# Patient Record
Sex: Female | Born: 1960 | Race: Black or African American | Hispanic: No | Marital: Single | Smoking: Light tobacco smoker
Health system: Southern US, Community
[De-identification: ages and names within clinical notes are randomized; demographics above are authoritative.]

## PROBLEM LIST (undated history)

## (undated) DIAGNOSIS — T7840XA Allergy, unspecified, initial encounter: Secondary | ICD-10-CM

## (undated) DIAGNOSIS — E785 Hyperlipidemia, unspecified: Secondary | ICD-10-CM

## (undated) DIAGNOSIS — D649 Anemia, unspecified: Secondary | ICD-10-CM

## (undated) DIAGNOSIS — D329 Benign neoplasm of meninges, unspecified: Secondary | ICD-10-CM

## (undated) DIAGNOSIS — I10 Essential (primary) hypertension: Secondary | ICD-10-CM

## (undated) DIAGNOSIS — H5440 Blindness, one eye, unspecified eye: Secondary | ICD-10-CM

## (undated) HISTORY — DX: Benign neoplasm of meninges, unspecified: D32.9

## (undated) HISTORY — DX: Hyperlipidemia, unspecified: E78.5

## (undated) HISTORY — PX: TUMOR REMOVAL: SHX12

## (undated) HISTORY — DX: Blindness, one eye, unspecified eye: H54.40

## (undated) HISTORY — DX: Essential (primary) hypertension: I10

## (undated) HISTORY — DX: Allergy, unspecified, initial encounter: T78.40XA

## (undated) HISTORY — DX: Anemia, unspecified: D64.9

---

## 2000-06-08 ENCOUNTER — Ambulatory Visit (HOSPITAL_COMMUNITY): Admission: RE | Admit: 2000-06-08 | Discharge: 2000-06-08 | Payer: Self-pay | Admitting: *Deleted

## 2000-08-12 ENCOUNTER — Ambulatory Visit (HOSPITAL_COMMUNITY): Admission: RE | Admit: 2000-08-12 | Discharge: 2000-08-12 | Payer: Self-pay | Admitting: *Deleted

## 2000-10-14 ENCOUNTER — Inpatient Hospital Stay (HOSPITAL_COMMUNITY): Admission: AD | Admit: 2000-10-14 | Discharge: 2000-10-18 | Payer: Self-pay | Admitting: *Deleted

## 2000-10-15 ENCOUNTER — Encounter: Payer: Self-pay | Admitting: Obstetrics & Gynecology

## 2000-10-16 ENCOUNTER — Encounter: Payer: Self-pay | Admitting: Obstetrics & Gynecology

## 2001-06-23 ENCOUNTER — Emergency Department (HOSPITAL_COMMUNITY): Admission: EM | Admit: 2001-06-23 | Discharge: 2001-06-23 | Payer: Self-pay | Admitting: Emergency Medicine

## 2001-10-05 ENCOUNTER — Emergency Department (HOSPITAL_COMMUNITY): Admission: EM | Admit: 2001-10-05 | Discharge: 2001-10-05 | Payer: Self-pay | Admitting: Emergency Medicine

## 2003-12-25 ENCOUNTER — Ambulatory Visit (HOSPITAL_COMMUNITY): Admission: RE | Admit: 2003-12-25 | Discharge: 2003-12-25 | Payer: Self-pay | Admitting: *Deleted

## 2004-12-30 ENCOUNTER — Ambulatory Visit (HOSPITAL_COMMUNITY): Admission: RE | Admit: 2004-12-30 | Discharge: 2004-12-30 | Payer: Self-pay | Admitting: *Deleted

## 2005-07-03 ENCOUNTER — Emergency Department (HOSPITAL_COMMUNITY): Admission: EM | Admit: 2005-07-03 | Discharge: 2005-07-03 | Payer: Self-pay | Admitting: Emergency Medicine

## 2005-12-23 ENCOUNTER — Emergency Department (HOSPITAL_COMMUNITY): Admission: EM | Admit: 2005-12-23 | Discharge: 2005-12-23 | Payer: Self-pay | Admitting: Emergency Medicine

## 2006-01-05 ENCOUNTER — Ambulatory Visit (HOSPITAL_COMMUNITY): Admission: RE | Admit: 2006-01-05 | Discharge: 2006-01-05 | Payer: Self-pay | Admitting: *Deleted

## 2007-01-06 ENCOUNTER — Ambulatory Visit (HOSPITAL_COMMUNITY): Admission: RE | Admit: 2007-01-06 | Discharge: 2007-01-06 | Payer: Self-pay | Admitting: Obstetrics & Gynecology

## 2007-06-30 ENCOUNTER — Emergency Department (HOSPITAL_COMMUNITY): Admission: EM | Admit: 2007-06-30 | Discharge: 2007-06-30 | Payer: Self-pay | Admitting: Emergency Medicine

## 2007-08-11 ENCOUNTER — Emergency Department (HOSPITAL_COMMUNITY): Admission: EM | Admit: 2007-08-11 | Discharge: 2007-08-11 | Payer: Self-pay | Admitting: Emergency Medicine

## 2008-01-10 ENCOUNTER — Ambulatory Visit (HOSPITAL_COMMUNITY): Admission: RE | Admit: 2008-01-10 | Discharge: 2008-01-10 | Payer: Self-pay | Admitting: Obstetrics & Gynecology

## 2008-02-12 ENCOUNTER — Emergency Department (HOSPITAL_COMMUNITY): Admission: EM | Admit: 2008-02-12 | Discharge: 2008-02-12 | Payer: Self-pay | Admitting: Emergency Medicine

## 2008-03-13 ENCOUNTER — Ambulatory Visit: Payer: Self-pay | Admitting: Family Medicine

## 2008-03-13 ENCOUNTER — Encounter (INDEPENDENT_AMBULATORY_CARE_PROVIDER_SITE_OTHER): Payer: Self-pay | Admitting: Nurse Practitioner

## 2008-03-13 LAB — CONVERTED CEMR LAB
ALT: 17 units/L (ref 0–35)
AST: 18 units/L (ref 0–37)
BUN: 14 mg/dL (ref 6–23)
Basophils Absolute: 0 10*3/uL (ref 0.0–0.1)
CO2: 23 meq/L (ref 19–32)
Calcium: 9.8 mg/dL (ref 8.4–10.5)
Chloride: 105 meq/L (ref 96–112)
Eosinophils Relative: 1 % (ref 0–5)
Lymphocytes Relative: 30 % (ref 12–46)
MCHC: 33 g/dL (ref 30.0–36.0)
MCV: 91.5 fL (ref 78.0–100.0)
Monocytes Relative: 4 % (ref 3–12)
Neutro Abs: 4 10*3/uL (ref 1.7–7.7)
Platelets: 396 10*3/uL (ref 150–400)
Potassium: 4.1 meq/L (ref 3.5–5.3)
Sodium: 142 meq/L (ref 135–145)
Total Bilirubin: 1.2 mg/dL (ref 0.3–1.2)
Total Protein: 8.8 g/dL — ABNORMAL HIGH (ref 6.0–8.3)
WBC: 6.2 10*3/uL (ref 4.0–10.5)

## 2008-03-16 ENCOUNTER — Ambulatory Visit: Payer: Self-pay | Admitting: *Deleted

## 2008-06-11 ENCOUNTER — Ambulatory Visit: Payer: Self-pay | Admitting: Internal Medicine

## 2008-11-04 ENCOUNTER — Emergency Department (HOSPITAL_COMMUNITY): Admission: EM | Admit: 2008-11-04 | Discharge: 2008-11-04 | Payer: Self-pay | Admitting: Emergency Medicine

## 2009-12-14 HISTORY — PX: CYST REMOVAL HAND: SHX6279

## 2010-04-22 ENCOUNTER — Emergency Department (HOSPITAL_COMMUNITY): Admission: EM | Admit: 2010-04-22 | Discharge: 2010-04-22 | Payer: Self-pay | Admitting: Emergency Medicine

## 2010-05-14 ENCOUNTER — Encounter: Admission: RE | Admit: 2010-05-14 | Discharge: 2010-05-14 | Payer: Self-pay | Admitting: Internal Medicine

## 2011-06-14 DIAGNOSIS — D329 Benign neoplasm of meninges, unspecified: Secondary | ICD-10-CM

## 2011-06-14 HISTORY — DX: Benign neoplasm of meninges, unspecified: D32.9

## 2011-08-19 ENCOUNTER — Other Ambulatory Visit (HOSPITAL_COMMUNITY): Payer: Self-pay | Admitting: Internal Medicine

## 2011-08-19 DIAGNOSIS — Z1231 Encounter for screening mammogram for malignant neoplasm of breast: Secondary | ICD-10-CM

## 2011-09-08 ENCOUNTER — Ambulatory Visit (HOSPITAL_COMMUNITY)
Admission: RE | Admit: 2011-09-08 | Discharge: 2011-09-08 | Disposition: A | Discharge status: Home / Self Care | Payer: Medicaid Other | Source: Ambulatory Visit | Attending: Internal Medicine | Admitting: Internal Medicine

## 2011-09-08 DIAGNOSIS — Z1231 Encounter for screening mammogram for malignant neoplasm of breast: Secondary | ICD-10-CM | POA: Insufficient documentation

## 2011-10-08 DIAGNOSIS — D497 Neoplasm of unspecified behavior of endocrine glands and other parts of nervous system: Secondary | ICD-10-CM | POA: Insufficient documentation

## 2011-10-08 DIAGNOSIS — D329 Benign neoplasm of meninges, unspecified: Secondary | ICD-10-CM | POA: Insufficient documentation

## 2012-04-14 DIAGNOSIS — Z9889 Other specified postprocedural states: Secondary | ICD-10-CM | POA: Insufficient documentation

## 2012-08-02 ENCOUNTER — Other Ambulatory Visit (HOSPITAL_COMMUNITY): Payer: Self-pay | Admitting: Internal Medicine

## 2012-08-02 DIAGNOSIS — Z1231 Encounter for screening mammogram for malignant neoplasm of breast: Secondary | ICD-10-CM

## 2012-08-18 ENCOUNTER — Ambulatory Visit (HOSPITAL_COMMUNITY): Payer: Medicaid Other

## 2012-09-08 ENCOUNTER — Ambulatory Visit (HOSPITAL_COMMUNITY)
Admission: RE | Admit: 2012-09-08 | Discharge: 2012-09-08 | Disposition: A | Discharge status: Home / Self Care | Payer: Medicaid Other | Source: Ambulatory Visit | Attending: Internal Medicine | Admitting: Internal Medicine

## 2012-09-08 DIAGNOSIS — Z1231 Encounter for screening mammogram for malignant neoplasm of breast: Secondary | ICD-10-CM | POA: Insufficient documentation

## 2012-10-27 DIAGNOSIS — D329 Benign neoplasm of meninges, unspecified: Secondary | ICD-10-CM | POA: Insufficient documentation

## 2013-08-23 ENCOUNTER — Other Ambulatory Visit: Payer: Self-pay | Admitting: Family Medicine

## 2013-08-23 DIAGNOSIS — Z1231 Encounter for screening mammogram for malignant neoplasm of breast: Secondary | ICD-10-CM

## 2013-09-12 ENCOUNTER — Ambulatory Visit (HOSPITAL_COMMUNITY): Payer: Medicaid Other | Attending: Family Medicine

## 2013-10-22 ENCOUNTER — Emergency Department (HOSPITAL_COMMUNITY)
Admission: EM | Admit: 2013-10-22 | Discharge: 2013-10-22 | Disposition: A | Discharge status: Home / Self Care | Payer: Medicaid Other | Attending: Emergency Medicine | Admitting: Emergency Medicine

## 2013-10-22 ENCOUNTER — Encounter (HOSPITAL_COMMUNITY): Payer: Self-pay | Admitting: Emergency Medicine

## 2013-10-22 DIAGNOSIS — F17200 Nicotine dependence, unspecified, uncomplicated: Secondary | ICD-10-CM | POA: Insufficient documentation

## 2013-10-22 DIAGNOSIS — R1084 Generalized abdominal pain: Secondary | ICD-10-CM | POA: Insufficient documentation

## 2013-10-22 DIAGNOSIS — K644 Residual hemorrhoidal skin tags: Secondary | ICD-10-CM | POA: Insufficient documentation

## 2013-10-22 DIAGNOSIS — R110 Nausea: Secondary | ICD-10-CM | POA: Insufficient documentation

## 2013-10-22 DIAGNOSIS — K625 Hemorrhage of anus and rectum: Secondary | ICD-10-CM

## 2013-10-22 LAB — BASIC METABOLIC PANEL
BUN: 14 mg/dL (ref 6–23)
CO2: 29 mEq/L (ref 19–32)
GFR calc non Af Amer: 74 mL/min — ABNORMAL LOW (ref >90)
Glucose, Bld: 91 mg/dL (ref 70–99)
Potassium: 3.5 mEq/L (ref 3.5–5.1)
Sodium: 142 mEq/L (ref 135–145)

## 2013-10-22 LAB — CBC WITH DIFFERENTIAL/PLATELET
Eosinophils Absolute: 0.2 10*3/uL (ref 0.0–0.7)
Eosinophils Relative: 4 % (ref 0–5)
Lymphs Abs: 1.8 10*3/uL (ref 0.7–4.0)
MCHC: 33.6 g/dL (ref 30.0–36.0)
MCV: 90.4 fL (ref 78.0–100.0)
Monocytes Absolute: 0.2 10*3/uL (ref 0.1–1.0)
Monocytes Relative: 4 % (ref 3–12)
Neutrophils Relative %: 52 % (ref 43–77)
WBC: 4.5 10*3/uL (ref 4.0–10.5)

## 2013-10-22 MED ORDER — HYDROCODONE-ACETAMINOPHEN 5-325 MG PO TABS: ORAL_TABLET | ORAL | Status: DC

## 2013-10-22 NOTE — ED Provider Notes (Signed)
Medical screening examination/treatment/procedure(s) were conducted as a shared visit with non-physician practitioner(s) and myself.  I personally evaluated the patient during the encounter.  EKG Interpretation   None      Patient seen examined. No surgical abdomen at this time. Patient's hemoglobin is stable. She denies syncope or near-syncope. She has been instructed to followup with gastroenterology and we have call to make that appointment. The strict return precautions given  Toy Baker, MD 10/22/13 1113

## 2013-10-22 NOTE — ED Notes (Addendum)
Pt comes from home with c/o dark red blood in stool since Thursday past.  Pt states she had some abdominal pain and her "stomach started gurgling," and she had a bowel movement with dark red blood.  Pt states stool was loose.  Pt states this morning she had a similar episode.  Pt c/o lower abdominal pain which she describes as pain, but not cramping.  Pt is guarding somewhat on exam.  Pt has bowel sounds in all quadrants - louder in lower.  Pt's lung sounds are clear, heart sounds normal and she has good pulses in all 4 extremities.  Pt denies N/V/D and excessive use of NSAIDS or aspirin.

## 2013-10-22 NOTE — ED Provider Notes (Signed)
CSN: [id number]     Arrival date & time 10/22/13  0730 History   First MD Initiated Contact with Patient 10/22/13 0749     Chief Complaint  Patient presents with  . Rectal Bleeding   (Consider location/radiation/quality/duration/timing/severity/associated sxs/prior Treatment) HPI Comments: Patient with no past history of abdominal surgeries, no history of colonoscopy -- presents with complaint of 3 days rectal bleeding and generalized abdominal pain, nausea. Patient noted dark to maroon stools 3 nights ago. She did not have another bowel movement until this morning when she noted maroon stools again. She has had generalized pain all over her abdomen since that time without fever, vomiting. No treatments prior to arrival. She has not noted bleeding of her gums or in urine. No easy bruising. She denies NSAID use. Patient does drink intermittently on weekends. Pain does not radiate. No lightheadedness or syncope. The onset of this condition was acute. The course is constant. Aggravating factors: none. Alleviating factors: none.    Patient is a 52 y.o. female presenting with hematochezia. The history is provided by the patient.  Rectal Bleeding Associated symptoms: abdominal pain   Associated symptoms: no fever and no vomiting     History reviewed. No pertinent past medical history. Past Surgical History  Procedure Laterality Date  . Cyst removal hand    . Tumor removal Left     eye   No family history on file. History  Substance Use Topics  . Smoking status: Current Some Day Smoker    Types: Cigars  . Smokeless tobacco: Not on file  . Alcohol Use: Yes     Comment: occasionally   OB History   Grav Para Term Preterm Abortions TAB SAB Ect Mult Living                 Review of Systems  Constitutional: Negative for fever.  HENT: Negative for rhinorrhea and sore throat.   Eyes: Negative for redness.  Respiratory: Negative for cough and shortness of breath.   Cardiovascular: Negative  for chest pain.  Gastrointestinal: Positive for abdominal pain, blood in stool and hematochezia. Negative for nausea, vomiting and diarrhea.  Genitourinary: Negative for dysuria and hematuria.  Musculoskeletal: Negative for myalgias.  Skin: Negative for rash.  Neurological: Negative for headaches.  Hematological: Does not bruise/bleed easily.    Allergies  Review of patient's allergies indicates no known allergies.  Home Medications  No current outpatient prescriptions on file. BP 121/84  Pulse 75  Temp(Src) 98 F (36.7 C) (Oral)  Resp 20  SpO2 100% Physical Exam  Nursing note and vitals reviewed. Constitutional: She appears well-developed and well-nourished.  HENT:  Head: Normocephalic and atraumatic.  Eyes: Conjunctivae are normal. Pupils are equal, round, and reactive to light. Right eye exhibits no discharge. Left eye exhibits no discharge.  Conj nml.   Neck: Normal range of motion. Neck supple.  Cardiovascular: Normal rate, regular rhythm and normal heart sounds.   Pulmonary/Chest: Effort normal and breath sounds normal.  Abdominal: Soft. There is no tenderness.  Genitourinary: Rectal exam shows external hemorrhoid. Rectal exam shows no internal hemorrhoid, no fissure, no mass, no tenderness and anal tone normal. Guaiac positive stool.  Small external hemorrhoid, no fissure, no active bleeding.   Neurological: She is alert.  Skin: Skin is warm and dry.  Psychiatric: She has a normal mood and affect.    ED Course  Procedures (including critical care time) Labs Review Labs Reviewed  BASIC METABOLIC PANEL - Abnormal; Notable for the following:  GFR calc non Af Amer 74 (*)    GFR calc Af Amer 86 (*)    All other components within normal limits  OCCULT BLOOD, POC DEVICE - Abnormal; Notable for the following:    Fecal Occult Bld POSITIVE (*)    All other components within normal limits  CBC WITH DIFFERENTIAL   Imaging Review No results found.  EKG Interpretation    None      Patient seen and examined. Work-up initiated.    Vital signs reviewed and are as follows: Filed Vitals:   10/22/13 0738  BP: 121/84  Pulse: 75  Temp: 98 F (36.7 C)  Resp: 20   Pt d/w Dr. Freida Busman. Spoke with Dr. Dulce Sellar who states patient can follow-up in office next week.   Dr. Freida Busman has seen patient. Agrees with d/c home. Patient given appropriate return instructions.   The patient was urged to return to the Emergency Department immediately with worsening of current symptoms, worsening abdominal pain, persistent vomiting, increasing blood noted in stools, syncope, lightheadedness, fever, or any other concerns. The patient verbalized understanding.     MDM   1. Rectal bleeding    GI bleed, suspect lower given maroon blood.   The following differential diagnoses were considered for this patient's lower GI bleed: *Hemorrhoids *Anal fissure *Colonic polyp *Proctitis *IBD *Infectious diarrhea *Diverticulosis *Mesenteric ischemia - young, limited risk factors *Colon cancer *Arteriovenous malformation  None of the following red flags identified or suspected: *Abnormal vital signs *Symptoms suggestive of malignancy such as constitutional symptoms (fever, weight loss), anemia, or change in frequency, caliber or consistency of stools * Family history of colon cancer  The patient appears reasonably screened and/or stabilized for discharge and I doubt any other medical condition or other emergency medical conditions requiring further screening, evaluation, or treatment in the ED at this time prior to discharge.  Follow-up: GI/PCP referral for sigmoidoscopy or colonoscopy     Renne Crigler, PA-C 10/22/13 1246

## 2013-10-22 NOTE — ED Notes (Signed)
Pt from home reports that she has dark blood in her stool, with abd pain on Thursday and again today. Pt denies SOB, SP. Weakness. Pt is A&O and in NAD

## 2013-10-22 NOTE — ED Provider Notes (Signed)
Medical screening examination/treatment/procedure(s) were conducted as a shared visit with non-physician practitioner(s) and myself.  I personally evaluated the patient during the encounter.  EKG Interpretation   None        Toy Baker, MD 10/22/13 1446

## 2013-10-23 ENCOUNTER — Ambulatory Visit (HOSPITAL_COMMUNITY)
Admission: RE | Admit: 2013-10-23 | Discharge: 2013-10-23 | Disposition: A | Discharge status: Home / Self Care | Payer: Medicaid Other | Source: Ambulatory Visit | Attending: Family Medicine | Admitting: Family Medicine

## 2013-10-23 DIAGNOSIS — Z1231 Encounter for screening mammogram for malignant neoplasm of breast: Secondary | ICD-10-CM | POA: Insufficient documentation

## 2014-01-05 ENCOUNTER — Ambulatory Visit (INDEPENDENT_AMBULATORY_CARE_PROVIDER_SITE_OTHER): Payer: Medicaid Other | Admitting: Family Medicine

## 2014-01-05 ENCOUNTER — Encounter: Payer: Self-pay | Admitting: Family Medicine

## 2014-01-05 ENCOUNTER — Telehealth: Payer: Self-pay | Admitting: Family Medicine

## 2014-01-05 VITALS — BP 125/81 | HR 63 | Temp 99.1°F | Ht 65.0 in | Wt 121.0 lb

## 2014-01-05 DIAGNOSIS — I10 Essential (primary) hypertension: Secondary | ICD-10-CM

## 2014-01-05 NOTE — Patient Instructions (Signed)
Continue taking your blood pressure medicine.   Follow up with me in 2 months. Check your blood pressure at the drug store and log it once or twice a week.

## 2014-01-05 NOTE — Telephone Encounter (Signed)
Pt in office now seeing MD.  Will hand form to MD to give to patient today.  Nataliyah Packham, Loralyn Freshwater, [place]

## 2014-01-05 NOTE — Telephone Encounter (Signed)
Pt dropped off form to be fill out regarding medical alert for disability certificate and would like for it to be mailed to [address] [place], [place]. [address]

## 2014-01-06 ENCOUNTER — Encounter: Payer: Self-pay | Admitting: Family Medicine

## 2014-01-06 DIAGNOSIS — I10 Essential (primary) hypertension: Secondary | ICD-10-CM | POA: Insufficient documentation

## 2014-01-06 NOTE — Assessment & Plan Note (Signed)
HTN: controled after patient took HCTZ on morning of visit. Continue with HCTZ 25mg  daily and follow up in 2 months with recommendation to check biweekly BP's at drug store and bring back log. Will also check CMP and lipid panel at that time

## 2014-01-06 NOTE — Progress Notes (Signed)
Patient ID: Cynthia Orozco, female   DOB: 1961/11/26, 53 y.o.   MRN: [id number]  Chief Complaint  Patient presents with  . New Patient   HPI Cynthia Orozco is a 53 y.o. female who presents to establish care as new patient.  She denies any current complaints other than hot flashes which have been ongoing for several months and occur unexpectedly. She is seen by a gynecologist and used to be on depo until recently.  She also reports having a history of hypertension. She admits to not taking her hctz 25mg  daily on a regular basis. She has gone months without taking it. She seldom checks her BP at home. She did take her hctz this morning.  She denies any chest pain, shortness of breath, lower extremity swelling, dizziness, light headedness, headache or change in vision.   Past Medical History  Diagnosis Date  . Allergy   . Anemia   . Hypertension   . Hyperlipidemia   . Meningioma July 2012    primary osseus Chordoid meningioma of left sphenoid  . Blindness of left eye     s/p resection of sphenoid meningioma, followed by Dr. Redmond Pulling at [facility] in left eye from resection of sphenoid meningioma  Past Surgical History  Procedure Laterality Date  . Cyst removal hand Left 2011  . Tumor removal Left July 18th 2012    left sphenoid primary osseus meningioma    Family History  Problem Relation Age of Onset  . Diabetes Mother   . Dementia Maternal Grandmother     Social History History  Substance Use Topics  . Smoking status: Current Some Day Smoker    Types: Cigars  . Smokeless tobacco: Not on file  . Alcohol Use: Yes     Comment: occasionally    Allergies  Allergen Reactions  . Apple Anaphylaxis    Only red apples so far, can tolerate green apples  . Grape Extract (Vitis Vinifera) [Vitis Viniferae] Anaphylaxis    Actual fruit , grapes  . Pineapple Anaphylaxis  . Strawberry Anaphylaxis    Current Outpatient Prescriptions  Medication Sig Dispense Refill  . fish  oil-omega-3 fatty acids 1000 MG capsule Take 1 g by mouth daily.      . hydrochlorothiazide (HYDRODIURIL) 25 MG tablet Take 25 mg by mouth daily.      Marland Kitchen HYDROcodone-acetaminophen (NORCO/VICODIN) 5-325 MG per tablet Take 1-2 tablets every 6 hours as needed for severe pain  8 tablet  0  . lovastatin (MEVACOR) 10 MG tablet Take 10 mg by mouth at bedtime.      . Multiple Vitamin (MULTIVITAMIN WITH MINERALS) TABS tablet Take 1 tablet by mouth daily.       No current facility-administered medications for this visit.    Review of Systems Review of Systems  Constitutional: Negative for fever, chills and fatigue.       Hot flashes  HENT: Negative for nosebleeds, sore throat, trouble swallowing and voice change.   Eyes: Positive for pain and visual disturbance.       Intermittent sharp pains that have been chronic since surgery.   Blind in left eye  Respiratory: Negative for cough, chest tightness and shortness of breath.   Cardiovascular: Negative for chest pain and leg swelling.  Gastrointestinal: Negative for constipation.  Endocrine: Negative for cold intolerance and heat intolerance.  Genitourinary: Negative for dysuria and frequency.  Musculoskeletal: Negative for back pain.  Skin: Negative for rash.  Allergic/Immunologic: Positive for food allergies.  Neurological: Positive for headaches. Negative for dizziness and light-headedness.  Psychiatric/Behavioral: Negative for confusion and sleep disturbance.    Blood pressure 125/81, pulse 63, temperature 99.1 F (37.3 C), temperature source Oral, height 5\' 5"  (1.651 m), weight 121 lb (54.885 kg).  Physical Exam Physical Exam General: alert and oriented x4, blind in left eye, no acute distress, very talkative HEENT: EOMI, pupils equal, round and reactive to light and accomodation bilaterally, ropharynx clear, nasal turbinates normal CV: I0X7, RRR, 2/6 systolic murmur best heard at left sternal border Pulm: CTA bilaterally Abdomen:  normal bowel sounds, mild discomfort with palpation of left lower quadrant, no rebound, no guarding, no hepatomegally Skin: normal, no rashes Ext: no edema  Data Reviewed Colonoscopy from 11/16/2013: diverticulosis in entire examined colon, 41mm polyp in proximal transverse colon with rec to repeat colonoscopy in 3 years EGD: normal  Assessment    53 yo female with h/o left sphenoid meningioma ressected and resulting in left eye blindness, as well as h/o HTN and Hyperlipidemia who presents to establish care    Plan    1. HTN: controled after patient took HCTZ on morning of visit. Continue with HCTZ 25mg  daily and follow up in 2 months with recommendation to check biweekly BP's at drug store and bring back log. Will also check CMP and lipid panel at that time 2. H/o sphenoid meningioma: followed at [facility] by Dr. Redmond Pulling 3. Hot flashes: recommended that she follow up with her gynecologist.         Cynthia Orozco 01/06/2014, 5:40 PM Family Medicine Residency, PGY-3

## 2014-01-10 ENCOUNTER — Ambulatory Visit (INDEPENDENT_AMBULATORY_CARE_PROVIDER_SITE_OTHER): Payer: Medicaid Other | Admitting: Family Medicine

## 2014-01-10 ENCOUNTER — Encounter: Payer: Self-pay | Admitting: Family Medicine

## 2014-01-10 VITALS — BP 114/75 | HR 64 | Temp 98.6°F | Resp 18 | Wt 123.0 lb

## 2014-01-10 DIAGNOSIS — R21 Rash and other nonspecific skin eruption: Secondary | ICD-10-CM

## 2014-01-10 DIAGNOSIS — I10 Essential (primary) hypertension: Secondary | ICD-10-CM

## 2014-01-10 LAB — COMPREHENSIVE METABOLIC PANEL
ALT: 11 U/L (ref 0–35)
AST: 15 U/L (ref 0–37)
Albumin: 4.9 g/dL (ref 3.5–5.2)
Alkaline Phosphatase: 102 U/L (ref 39–117)
BILIRUBIN TOTAL: 1.1 mg/dL (ref 0.2–1.2)
BUN: 12 mg/dL (ref 6–23)
CHLORIDE: 98 meq/L (ref 96–112)
CO2: 29 mEq/L (ref 19–32)
CREATININE: 0.85 mg/dL (ref 0.50–1.10)
Calcium: 10.4 mg/dL (ref 8.4–10.5)
Glucose, Bld: 83 mg/dL (ref 70–99)
POTASSIUM: 3.8 meq/L (ref 3.5–5.3)
SODIUM: 137 meq/L (ref 135–145)
TOTAL PROTEIN: 8 g/dL (ref 6.0–8.3)

## 2014-01-10 LAB — LIPID PANEL
CHOL/HDL RATIO: 3.4 ratio
CHOLESTEROL: 217 mg/dL — AB (ref 0–200)
HDL: 64 mg/dL (ref >39)
LDL CALC: 142 mg/dL — AB (ref 0–99)
Triglycerides: 56 mg/dL (ref <150)
VLDL: 11 mg/dL (ref 0–40)

## 2014-01-10 MED ORDER — PREDNISONE 10 MG PO TABS: ORAL_TABLET | ORAL | Status: DC

## 2014-01-10 MED ORDER — CLOBETASOL PROPIONATE 0.05 % EX OINT: 1.0000 "application " | TOPICAL_OINTMENT | Freq: Two times a day (BID) | CUTANEOUS | Status: DC

## 2014-01-10 NOTE — Patient Instructions (Signed)
The rash you are having could be caused by a couple different things:  Molluscum contagiosum or bullous pemphigoid. We are going to treat with steroids and see if it helps.  If it gets any worst, please return to care.  Follow up with me in 1 week to see how it's doing.   Bullous Pemphigoid Bullous pemphigoid is a rare disorder of the skin that causes large blisters. Blisters are thin sacs that contain clear fluid. This disorder is mostly seen in people over age 51. It can flare up for weeks or months. These flare-ups usually go away suddenly, and it may be months or years before another flare-up develops. The blisters appear most often in the groin, armpits, trunk, thighs, and forearms. These blisters can cause great difficulty with your daily activities because of severe itching and irritation. In most cases, this condition disappears completely within 6 years. However, a small number of people may still have flare-ups after completing treatment. CAUSES  Bullous pemphigoid occurs when the body's own immune system attacks the layer of tissue beneath your skin. It is unknown why this attack occurs. In some cases, medicines can trigger blisters to form. SYMPTOMS  The severity of flare-ups varies from person to person. In mild cases, there may be a few small blisters with slight redness or irritation. In severe cases, blisters are larger and there are many of them located in more areas of the body. Itching, redness, and irritation may be severe. The blisters may even break open to form sores (ulcers). Mouth sores and bleeding gums can develop. This makes eating difficult. A recurrent cough or pain with swallowing can also develop if the deeper part of the throat is affected. There may also be nosebleeds if the inner part of the nose is affected. DIAGNOSIS  Your caregiver will do a physical exam. He or she may also take a tissue sample (biopsy) from the skin. This sample is examined in a lab to look for  abnormal antibodies. Blood tests may also be done. TREATMENT  Treatment is aimed at relieving your symptoms and preventing infection. Medicines prescribed by your caregiver may include:  Corticosteroids. These may be given as a pill or cream applied to the skin.  Antibiotics.  Vitamin B complex.  Medicines that suppress your immune system (immunosuppressive drugs). Severe symptoms or complications from an infection may require hospitalization. This may be necessary for:   Management of wounds and ulcers.  Giving medicines through an intravenous line (IV).  Feeding if severe blisters or ulcers are affecting the mouth. HOME CARE INSTRUCTIONS   Only take medicines as directed by your caregiver.  If your mouth or lips are affected, your diet should only include soft foods and liquids.  If your mouth or lips are affected, avoid drinking very hot liquids.  Do not break or drain your blisters.  Follow your caregiver's instructions for wearing bandages (dressings) over your blisters or ulcers. SEEK MEDICAL CARE IF:   Your itching or pain is not helped by medicine.  You develop unexplained blisters on your skin.  You develop redness, swelling, or pain that extends beyond your blisters or ulcers.  You notice yellowish-white fluid (pus) coming from your wounds. SEEK IMMEDIATE MEDICAL CARE IF:   Your pain becomes severe.  You cannot eat or drink because of blisters, ulcers, or pain in your lips or mouth.  You cannot care for yourself because of blisters, ulcers, or pain in your hands or the soles of your feet.  You have a fever. MAKE SURE YOU:   Understand these instructions.  Will watch your condition.  Will get help right away if you are not doing well or get worse. Document Released: 09/27/2007 Document Revised: 02/22/2012 Document Reviewed: 11/10/2011 [facility] Patient Information 2014 [place], [place].

## 2014-01-11 ENCOUNTER — Telehealth: Payer: Self-pay | Admitting: Family Medicine

## 2014-01-11 ENCOUNTER — Encounter: Payer: Self-pay | Admitting: Family Medicine

## 2014-01-11 DIAGNOSIS — R21 Rash and other nonspecific skin eruption: Secondary | ICD-10-CM | POA: Insufficient documentation

## 2014-01-11 LAB — HIV ANTIBODY (ROUTINE TESTING W REFLEX): HIV: NONREACTIVE

## 2014-01-11 NOTE — Telephone Encounter (Signed)
Please let patient know that patient's HIV test was normal and that her cholesterol tests showed that she did not need a cholesterol medicine at this time. Her complete metabolic panel was normal as well.  I will send her a report of her lipid panel and CMP.  Thank you!  [name], PGY-3 Family Medicine Resident

## 2014-01-11 NOTE — Assessment & Plan Note (Signed)
Patient is fasting this am and we will therefore get CMP and lipid panel.

## 2014-01-11 NOTE — Progress Notes (Signed)
Patient ID: Cynthia Orozco    DOB: 1961-05-30, 53 y.o.   MRN: [id number] --- Subjective:  Cynthia Orozco is a 53 y.o.female who presents with concern of rash.  - rash: started on left index finger about a week ago. Started with one small fluid filled blister than then became bigger and had more next to it. It then spread to her forearm and now she has lesions on her chest and her right hand as well. She denies any lesions around groin, abdomen or legs. She states she had one blister in her mouth but it has resolved. They itch and hurt. They have not drained anything. No fevers or chills. No new medications. She drank some mango juice prior to this happening. She denies any new detergents or soaps. Nobody around her has this. No recent travel. No gardening. She has not tried anything for it. She has never had anything like this before.   ROS: see HPI Past Medical History: reviewed and updated medications and allergies. Social History: Tobacco: smokes occasional cigars  Objective: Filed Vitals:   01/10/14 0937  BP: 114/75  Pulse: 64  Temp: 98.6 F (37 C)  Resp: 18    Physical Examination:   General appearance - alert, well appearing, and in no distress Skin - vesicular umbilicated clusters on left index finger as well as left forearm. Left forearm lesion has some overlying crustiness.  Single umbilicated nodule on right hand. Similar lesions on right side of chest.  Mouth - no lesions seen

## 2014-01-11 NOTE — Assessment & Plan Note (Addendum)
Vesicular and umbilicated. Differential includes: molluscum contagiosum vs bullous pemphigoid. No systemic symptoms which is reassuring. Doesn't appear dermatomal, so shingles unlikely.  - will empirically treat like bullous pemphigoid with topical steroids and oral steroids (see med list) - will also check HIV status in case this is molluscum contagiosum.  - follow up in 1 week. Return to care sooner if worsening or not improving.

## 2014-01-12 NOTE — Telephone Encounter (Signed)
Letter mailed with this information.  Cynthia Orozco, Cynthia Orozco, Cynthia Orozco

## 2014-01-12 NOTE — Telephone Encounter (Signed)
Attempted to call patient, Cynthia accepting calls at this time.  Emmah Bratcher, Loralyn Freshwater, [place]

## 2014-01-18 ENCOUNTER — Ambulatory Visit (INDEPENDENT_AMBULATORY_CARE_PROVIDER_SITE_OTHER): Payer: Medicaid Other | Admitting: Family Medicine

## 2014-01-18 ENCOUNTER — Encounter: Payer: Self-pay | Admitting: Family Medicine

## 2014-01-18 VITALS — BP 130/81 | HR 65 | Temp 98.6°F | Ht 65.0 in | Wt 124.0 lb

## 2014-01-18 DIAGNOSIS — Z23 Encounter for immunization: Secondary | ICD-10-CM

## 2014-01-18 DIAGNOSIS — R21 Rash and other nonspecific skin eruption: Secondary | ICD-10-CM

## 2014-01-18 NOTE — Assessment & Plan Note (Addendum)
Healing with clobetasol ointment and prednisone taper. No systemic symptoms which is reassuring. Differential on presentation was molluscum contagiosum vs urticaria vs bullous pemphigoid. She had a normal HIV test and rash has responded to steroids making molluscum contagiosum less likely.  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  - continue clobetasol until resolves completely - patient to call clinic in case she has flare up of rash after finishing prednisone taper. If that is the case, will extend taper a little longer.

## 2014-01-18 NOTE — Progress Notes (Signed)
Patient ID: Lusine Corlett Czarnecki    DOB: 11-09-1961, 53 y.o.   MRN: [id number] --- Subjective:  Hanah is a 53 y.o.female who presents for follow up on rash.  - rash has improved. It doesn't itch or hurt anymore. She has been applying clobetasol ointment 2-3 times a day. She has also been taking the prednisone and tomorrow will be taking her last dose of prednisone 10mg  daily. She states that the lesions look like they are healing.  No fevers, no chills. Eating and drinking well.   ROS: see HPI Past Medical History: reviewed and updated medications and allergies. Social History: Tobacco: some day cigar smoker  Objective: Filed Vitals:   01/18/14 1056  BP: 130/81  Pulse: 65  Temp: 98.6 F (37 C)    Physical Examination:   General appearance - alert, well appearing, and in no distress Chest - clear to auscultation, no wheezes, rales or rhonchi, symmetric air entry Skin - healing blister on left index finger, scabbed patch on left forearm, 2-3 scarring papules on right upper chest

## 2014-01-18 NOTE — Patient Instructions (Signed)
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 Continue with the steroid cream until it goes away.  If the rash gets worst after stopping the steroids, let me know and we'll extend the taper.

## 2014-02-06 ENCOUNTER — Ambulatory Visit: Payer: Medicaid Other

## 2014-02-12 ENCOUNTER — Ambulatory Visit (INDEPENDENT_AMBULATORY_CARE_PROVIDER_SITE_OTHER): Payer: Medicaid Other | Admitting: *Deleted

## 2014-02-12 DIAGNOSIS — Z111 Encounter for screening for respiratory tuberculosis: Secondary | ICD-10-CM

## 2014-02-14 ENCOUNTER — Ambulatory Visit (INDEPENDENT_AMBULATORY_CARE_PROVIDER_SITE_OTHER): Payer: Medicaid Other | Admitting: *Deleted

## 2014-02-14 ENCOUNTER — Encounter: Payer: Self-pay | Admitting: *Deleted

## 2014-02-14 DIAGNOSIS — Z111 Encounter for screening for respiratory tuberculosis: Secondary | ICD-10-CM

## 2014-02-14 LAB — TB SKIN TEST
Induration: 0 mm
TB SKIN TEST: NEGATIVE

## 2014-02-28 ENCOUNTER — Ambulatory Visit: Payer: Medicaid Other | Admitting: Family Medicine

## 2014-04-27 ENCOUNTER — Other Ambulatory Visit: Payer: Self-pay | Admitting: Family Medicine

## 2014-04-30 ENCOUNTER — Telehealth: Payer: Self-pay | Admitting: Family Medicine

## 2014-04-30 MED ORDER — HYDROCHLOROTHIAZIDE 25 MG PO TABS: 25.0000 mg | ORAL_TABLET | Freq: Every day | ORAL | Status: DC

## 2014-04-30 NOTE — Telephone Encounter (Signed)
Refilled hctz 25mg  daily.   [name], PGY-3 Family Medicine Resident

## 2014-04-30 NOTE — Telephone Encounter (Signed)
Forward to PCP for refill.Cynthia Orozco  

## 2014-04-30 NOTE — Telephone Encounter (Signed)
Pt called and needs a refill on her Hydrochlorothiazide called in. jw °

## 2014-08-14 ENCOUNTER — Encounter: Payer: Self-pay | Admitting: Family Medicine

## 2014-08-28 DIAGNOSIS — D333 Benign neoplasm of cranial nerves: Secondary | ICD-10-CM | POA: Insufficient documentation

## 2014-10-04 ENCOUNTER — Other Ambulatory Visit (HOSPITAL_COMMUNITY): Payer: Self-pay | Admitting: Nurse Practitioner

## 2014-10-04 DIAGNOSIS — Z1231 Encounter for screening mammogram for malignant neoplasm of breast: Secondary | ICD-10-CM

## 2014-10-25 ENCOUNTER — Ambulatory Visit (HOSPITAL_COMMUNITY)
Admission: RE | Admit: 2014-10-25 | Discharge: 2014-10-25 | Disposition: A | Discharge status: Home / Self Care | Payer: Medicaid Other | Source: Ambulatory Visit | Attending: Nurse Practitioner | Admitting: Nurse Practitioner

## 2014-10-25 DIAGNOSIS — Z1231 Encounter for screening mammogram for malignant neoplasm of breast: Secondary | ICD-10-CM | POA: Diagnosis not present

## 2014-10-26 ENCOUNTER — Ambulatory Visit (INDEPENDENT_AMBULATORY_CARE_PROVIDER_SITE_OTHER): Payer: Medicaid Other | Admitting: *Deleted

## 2014-10-26 ENCOUNTER — Ambulatory Visit (INDEPENDENT_AMBULATORY_CARE_PROVIDER_SITE_OTHER): Payer: Medicaid Other | Admitting: Family Medicine

## 2014-10-26 ENCOUNTER — Encounter: Payer: Self-pay | Admitting: Family Medicine

## 2014-10-26 VITALS — BP 122/84 | HR 82 | Temp 98.6°F | Ht 65.0 in | Wt 137.0 lb

## 2014-10-26 DIAGNOSIS — D429 Neoplasm of uncertain behavior of meninges, unspecified: Secondary | ICD-10-CM

## 2014-10-26 DIAGNOSIS — I10 Essential (primary) hypertension: Secondary | ICD-10-CM

## 2014-10-26 DIAGNOSIS — A6000 Herpesviral infection of urogenital system, unspecified: Secondary | ICD-10-CM | POA: Diagnosis not present

## 2014-10-26 DIAGNOSIS — R21 Rash and other nonspecific skin eruption: Secondary | ICD-10-CM

## 2014-10-26 DIAGNOSIS — Z23 Encounter for immunization: Secondary | ICD-10-CM

## 2014-10-26 DIAGNOSIS — D497 Neoplasm of unspecified behavior of endocrine glands and other parts of nervous system: Secondary | ICD-10-CM

## 2014-10-26 DIAGNOSIS — R221 Localized swelling, mass and lump, neck: Secondary | ICD-10-CM

## 2014-10-26 MED ORDER — HYDROCHLOROTHIAZIDE 25 MG PO TABS: 25.0000 mg | ORAL_TABLET | Freq: Every day | ORAL | Status: DC

## 2014-10-26 MED ORDER — CLOBETASOL PROPIONATE 0.05 % EX OINT: 1.0000 "application " | TOPICAL_OINTMENT | Freq: Two times a day (BID) | CUTANEOUS | Status: DC

## 2014-10-26 NOTE — Assessment & Plan Note (Addendum)
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

## 2014-10-26 NOTE — Assessment & Plan Note (Signed)
Well-controlled HTN  No complications   Plan:  1. Continue HCTZ 25mg  daily, rx'd 90 day supply refills x 1 year  2. No labs today - last checked 12/2013, Cr stable 0.8 at b/l. Re-check BMET prior to next annual visit 3. Lifestyle Mods - continue regular exercise, low Na, inc veg/fruit

## 2014-10-26 NOTE — Progress Notes (Signed)
Subjective:    Patient ID: Cynthia Orozco, female    DOB: 07-19-1961, 53 y.o.   MRN: [id number]  Patient presents for Annual Physical and to meet new PCP, already established at [facility].  HPI  CHRONIC HTN: Reports - no complaints, no prior other anti-HTN, does not check BP at home Current Meds - HCTZ 25mg  daily   Reports good compliance, took meds today. Tolerating well, w/o complaints. Lifestyle - rides bicycle on weekends, some walks during warm weather, limit Na intake (cooks at home, does not add salt, inc green vegetables, fruits) Denies CP, dyspnea, HA, edema, dizziness / lightheadedness  Hx Blistering Rash: - Currently resolved, some red blisters associated with itching, using Clobetasol with good relief - Requesting refill for future breakouts - Denies warmth, redness, pain, swelling  Hyperlipidemia: - Last fasting lipid panel 12/2013 - Recently discontinued Lovastatin (4 months ago) at [facility] told that cholesterol as stable - Currently taking Fish Oil  Hx Left Sphenoid Meningeoma, s/p surgical resection - Followed by Dr. Harrison Mons ([facility]), initial surgery in 2012 for mass excision, routine follow-up yearly with MRI - Reports hx chronic pain on left side of face / head since surgery, admits to "feeling of numbness" chronic and unchanged since surgery due to nerve damage from left top of head down to lower left jawline and neck. Continues to experience intermittent sharp pains within this distribution. - Does not take any medications for this. Previously failed multiple trials without relief - Currently scheduled for surgery in 1 week on 11/01/14 (Dr. Morrison Old) (tumor removal on right neck, jugular region) - Admits chronic loss of vision Left eye, facial numbness, headaches - Denies any new complaints, no new focal weakness or numbness, vision changes  HSV2: - Hx genital herpes, previously with lesions years ago, has been well controlled on Valacyclovir  without any recent breakouts. Followed by GYN at [facility]. No new complaints  HM: - Last Colonoscopy 11/2013 (hematochezia, s/p polypectomy, repeat in 3 years, next due 2017) - Last Pap smear: 2014, negative (followed by GYN) - Last Mammogram: 10/23/14, negative - Contraception: Depo-provera q 3 months  I have reviewed and updated the following as appropriate: allergies and current medications  Social Hx: - Quit smoking cigarettes 10/14/13, overall max pack per week < 10 years - occasional black & mild cigar and alcohol with glass wine 2-3x weekly - Currently applying for disability s/p meningioma surgical removal 2012, applied in 2012, recently she stated that her attorneys have called and will be requesting records (from [facility], [facility], [facility]) to be sent, has a court date on December 19, 2014.  Review of Systems  See above HPI    Objective:   Physical Exam  BP 122/84 mmHg  Pulse 82  Temp(Src) 98.6 F (37 C) (Oral)  Ht 5\' 5"  (1.651 m)  Wt 137 lb (62.143 kg)  BMI 22.80 kg/m2  Gen - well-appearing, pleasant, NAD HEENT - NCAT, PERRL, EOMI, patent nares w/o congestion, oropharynx clear, MMM Neck - supple, non-tender, no significant masses or LAD Heart - RRR, no murmurs heard Lungs - CTAB. Normal work of breathing. Ext - non-tender, no edema, peripheral pulses intact +2 b/l Skin - warm, dry, no rashes Neuro - awake, alert, oriented, grossly non-focal, intact muscle strength 5/5 b/l grip and ankles, intact distal sensation to light touch, chronic decreased sensation to light touch along left head / face s/p surgery. Gait normal     Assessment & Plan:   See specific  A&P problem list for details.

## 2014-10-26 NOTE — Assessment & Plan Note (Signed)
Stable, chronic s/p resection 2012 - Some recurrence of small neck tumor located in Right jugular region, scheduled for removal at [place]: 1. Follow-up with [facility] as scheduled for yearly monitoring for recurrence, MRIs

## 2014-10-26 NOTE — Assessment & Plan Note (Addendum)
>>  ASSESSMENT AND PLAN FOR BENIGN NEOPLASM OF MENINGES (HCC) WRITTEN ON 01/27/2024  9:14 AM BY Celine Mans, MD   >>ASSESSMENT AND PLAN FOR MENINGEAL TUMOR WRITTEN ON 10/26/2014  2:32 PM BY KARAMALEGOS, ALEXANDER, DO  Stable, chronic s/p resection 2012 - Some recurrence of small neck tumor located in Right jugular region, scheduled for removal at [facility]  Plan: 1. 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

## 2014-10-26 NOTE — Assessment & Plan Note (Signed)
Mostly resolved with Clobetasol  Plan: 1. Refilled Clobetasol for PRN use 2. Return if worsens

## 2014-10-26 NOTE — Assessment & Plan Note (Signed)
Stable, on suppression therapy  Plan: 1. Followed by Joseph suppression with Valacyclovir

## 2014-10-26 NOTE — Patient Instructions (Signed)
Dear Cynthia Orozco, Thank you for coming in to clinic today. It was good to meet you!  1. Overall sounds like you are doing well! Please keep me posted on the status of your surgical procedure and future follow-up, if you could request that surgery and follow-up records are sent to our office that would be great. 2. For your Blood Pressure - it is well controlled. Sent in 90 day supply HCTZ and refills for 1 year. 3. Flu shot today 4. Due for colonoscopy in 2017 5. [facility] with [facility] for annual pap smear  Some important numbers from today's visit: BP - 122/84 Results -  If you would like additional blood work, please call back to schedule a nurse only visit at anytime after 01/14/2015. Otherwise, you are fine to not have labs until 1 month before your next yearly appointment.  Please schedule a follow-up appointment with Dr. Parks Ranger in 1 year for annual physical.  If you have any other questions or concerns, please feel free to call the clinic to contact me. You may also schedule an earlier appointment if necessary.  However, if your symptoms get significantly worse, please go to the Emergency Department to seek immediate medical attention.  Nobie Putnam, [place]

## 2014-11-01 ENCOUNTER — Encounter: Payer: Self-pay | Admitting: Family Medicine

## 2014-11-03 ENCOUNTER — Encounter (HOSPITAL_COMMUNITY): Payer: Self-pay | Admitting: *Deleted

## 2014-11-03 ENCOUNTER — Emergency Department (HOSPITAL_COMMUNITY)
Admission: EM | Admit: 2014-11-03 | Discharge: 2014-11-03 | Disposition: A | Discharge status: Home / Self Care | Payer: Medicaid Other | Attending: Emergency Medicine | Admitting: Emergency Medicine

## 2014-11-03 DIAGNOSIS — I10 Essential (primary) hypertension: Secondary | ICD-10-CM | POA: Diagnosis not present

## 2014-11-03 DIAGNOSIS — Z3202 Encounter for pregnancy test, result negative: Secondary | ICD-10-CM | POA: Insufficient documentation

## 2014-11-03 DIAGNOSIS — Z8639 Personal history of other endocrine, nutritional and metabolic disease: Secondary | ICD-10-CM | POA: Insufficient documentation

## 2014-11-03 DIAGNOSIS — R1032 Left lower quadrant pain: Secondary | ICD-10-CM | POA: Diagnosis not present

## 2014-11-03 DIAGNOSIS — Z91040 Latex allergy status: Secondary | ICD-10-CM | POA: Diagnosis not present

## 2014-11-03 DIAGNOSIS — Z79899 Other long term (current) drug therapy: Secondary | ICD-10-CM | POA: Diagnosis not present

## 2014-11-03 DIAGNOSIS — Z7951 Long term (current) use of inhaled steroids: Secondary | ICD-10-CM | POA: Insufficient documentation

## 2014-11-03 DIAGNOSIS — Z720 Tobacco use: Secondary | ICD-10-CM | POA: Diagnosis not present

## 2014-11-03 DIAGNOSIS — Z7952 Long term (current) use of systemic steroids: Secondary | ICD-10-CM | POA: Insufficient documentation

## 2014-11-03 DIAGNOSIS — R1030 Lower abdominal pain, unspecified: Secondary | ICD-10-CM | POA: Diagnosis present

## 2014-11-03 DIAGNOSIS — R197 Diarrhea, unspecified: Secondary | ICD-10-CM | POA: Diagnosis not present

## 2014-11-03 DIAGNOSIS — H5442 Blindness, left eye, normal vision right eye: Secondary | ICD-10-CM | POA: Diagnosis not present

## 2014-11-03 DIAGNOSIS — Z862 Personal history of diseases of the blood and blood-forming organs and certain disorders involving the immune mechanism: Secondary | ICD-10-CM | POA: Diagnosis not present

## 2014-11-03 LAB — URINALYSIS, ROUTINE W REFLEX MICROSCOPIC
Bilirubin Urine: NEGATIVE
GLUCOSE, UA: NEGATIVE mg/dL
Hgb urine dipstick: NEGATIVE
KETONES UR: NEGATIVE mg/dL
Nitrite: NEGATIVE
Protein, ur: NEGATIVE mg/dL
Specific Gravity, Urine: 1.027 (ref 1.005–1.030)
Urobilinogen, UA: 0.2 mg/dL (ref 0.0–1.0)
pH: 5.5 (ref 5.0–8.0)

## 2014-11-03 LAB — CBC WITH DIFFERENTIAL/PLATELET
Basophils Absolute: 0 10*3/uL (ref 0.0–0.1)
Basophils Relative: 0 % (ref 0–1)
EOS ABS: 0.2 10*3/uL (ref 0.0–0.7)
Eosinophils Relative: 2 % (ref 0–5)
HCT: 39.2 % (ref 36.0–46.0)
HEMOGLOBIN: 13.2 g/dL (ref 12.0–15.0)
LYMPHS ABS: 2.1 10*3/uL (ref 0.7–4.0)
LYMPHS PCT: 27 % (ref 12–46)
MCH: 30.6 pg (ref 26.0–34.0)
MCHC: 33.7 g/dL (ref 30.0–36.0)
MCV: 90.7 fL (ref 78.0–100.0)
MONOS PCT: 5 % (ref 3–12)
Monocytes Absolute: 0.4 10*3/uL (ref 0.1–1.0)
NEUTROS PCT: 66 % (ref 43–77)
Neutro Abs: 5.1 10*3/uL (ref 1.7–7.7)
Platelets: 286 10*3/uL (ref 150–400)
RBC: 4.32 MIL/uL (ref 3.87–5.11)
RDW: 12.6 % (ref 11.5–15.5)
WBC: 7.8 10*3/uL (ref 4.0–10.5)

## 2014-11-03 LAB — COMPREHENSIVE METABOLIC PANEL
ALT: 43 U/L — ABNORMAL HIGH (ref 0–35)
AST: 46 U/L — AB (ref 0–37)
Albumin: 4.1 g/dL (ref 3.5–5.2)
Alkaline Phosphatase: 90 U/L (ref 39–117)
Anion gap: 15 (ref 5–15)
BUN: 15 mg/dL (ref 6–23)
CALCIUM: 9.5 mg/dL (ref 8.4–10.5)
CO2: 22 mEq/L (ref 19–32)
CREATININE: 0.92 mg/dL (ref 0.50–1.10)
Chloride: 105 mEq/L (ref 96–112)
GFR calc non Af Amer: 71 mL/min — ABNORMAL LOW (ref >90)
GFR, EST AFRICAN AMERICAN: 82 mL/min — AB (ref >90)
Glucose, Bld: 89 mg/dL (ref 70–99)
Potassium: 3.3 mEq/L — ABNORMAL LOW (ref 3.7–5.3)
Sodium: 142 mEq/L (ref 137–147)
TOTAL PROTEIN: 7.7 g/dL (ref 6.0–8.3)
Total Bilirubin: 1.1 mg/dL (ref 0.3–1.2)

## 2014-11-03 LAB — URINE MICROSCOPIC-ADD ON

## 2014-11-03 LAB — PREGNANCY, URINE: Preg Test, Ur: NEGATIVE

## 2014-11-03 LAB — LIPASE, BLOOD: Lipase: 36 U/L (ref 11–59)

## 2014-11-03 MED ORDER — HYDROCODONE-ACETAMINOPHEN 5-325 MG PO TABS: 1.0000 | ORAL_TABLET | Freq: Four times a day (QID) | ORAL | Status: DC | PRN

## 2014-11-03 MED ORDER — MORPHINE SULFATE 4 MG/ML IJ SOLN
4.0000 mg | Freq: Once | INTRAMUSCULAR | Status: AC
  Administered 2014-11-03: 4 mg via INTRAVENOUS
  Filled 2014-11-03: qty 1

## 2014-11-03 MED ORDER — FAMOTIDINE 20 MG PO TABS
20.0000 mg | ORAL_TABLET | Freq: Once | ORAL | Status: AC
  Administered 2014-11-03: 20 mg via ORAL
  Filled 2014-11-03: qty 1

## 2014-11-03 MED ORDER — FAMOTIDINE 20 MG PO TABS: 20.0000 mg | ORAL_TABLET | Freq: Two times a day (BID) | ORAL | Status: DC

## 2014-11-03 MED ORDER — KETOROLAC TROMETHAMINE 30 MG/ML IJ SOLN
30.0000 mg | Freq: Once | INTRAMUSCULAR | Status: AC
  Administered 2014-11-03: 30 mg via INTRAVENOUS
  Filled 2014-11-03: qty 1

## 2014-11-03 MED ORDER — IBUPROFEN 800 MG PO TABS: 800.0000 mg | ORAL_TABLET | Freq: Three times a day (TID) | ORAL | Status: DC

## 2014-11-03 NOTE — ED Notes (Signed)
Bed: WA01 Expected date:  Expected time:  Means of arrival:  Comments: Screaming pt

## 2014-11-03 NOTE — ED Provider Notes (Signed)
CSN: [id number]     Arrival date & time 11/03/14  1617 History   First MD Initiated Contact with Patient 11/03/14 1653     Chief Complaint  Patient presents with  . Abdominal Pain     (Consider location/radiation/quality/duration/timing/severity/associated sxs/prior Treatment) HPI Comments: The patient is a 53 year old female with past medical history of hypertension, anemia presenting to the emergency room and chief complaint of lower abdominal discomfort since yesterday. Patient describes lower discomfort as cramping, menstrual type discomfort. She was reports taking Midol, over-the-counter medication, hot chocolate, warm compress, teas without resolution of symptoms. She reports similar episodes with previous "reflux" in the past, stating her reflux is in her lower abdomen. Patient reports 2 episodes of nonbloody diarrhea last night, resolved. She reports recently receiving Depo-Provera earlier this week prior to onset of discomfort. She denies nausea, vomiting, dysuria, hematuria, abnormal vaginal discharge, abnormal vaginal bleeding. LNMP 15 years ago. Received depo shots since.  Patient is a 53 y.o. female presenting with abdominal pain. The history is provided by the patient. No language interpreter was used.  Abdominal Pain Associated symptoms: diarrhea   Associated symptoms: no chills, no constipation, no dysuria, no fever, no hematuria, no nausea, no vaginal bleeding and no vaginal discharge     Past Medical History  Diagnosis Date  . Allergy   . Anemia   . Hypertension   . Hyperlipidemia   . Meningioma July 2012    primary osseus Chordoid meningioma of left sphenoid. followed at [facility] by Dr. Redmond Pulling  . Blindness of left eye     s/p resection of sphenoid meningioma   Past Surgical History  Procedure Laterality Date  . Cyst removal hand Left 2011  . Tumor removal Left July 18th 2012    left sphenoid primary osseus meningioma   Family History  Problem Relation Age of  Onset  . Diabetes Mother   . Dementia Maternal Grandmother    History  Substance Use Topics  . Smoking status: Current Some Day Smoker    Types: Cigars  . Smokeless tobacco: Not on file     Comment: Black and mild once a week  . Alcohol Use: Yes     Comment: occasionally   OB History    No data available     Review of Systems  Constitutional: Negative for fever and chills.  Gastrointestinal: Positive for abdominal pain and diarrhea. Negative for nausea and constipation.  Genitourinary: Negative for dysuria, urgency, hematuria, vaginal bleeding and vaginal discharge.      Allergies  Apple; Grape extract (vitis vinifera); Pineapple; Strawberry; Latex; and Other  Home Medications   Prior to Admission medications   Medication Sig Start Date End Date Taking? Authorizing Provider  calcium carbonate (CALCIUM 600) 600 MG TABS tablet Take 600 mg by mouth.    Historical Provider, MD  clobetasol ointment (TEMOVATE) 0.16 % Apply 1 application topically 2 (two) times daily. 10/26/14   Nobie Putnam, DO  fish oil-omega-3 fatty acids 1000 MG capsule Take 1 g by mouth daily.    Historical Provider, MD  fluticasone (FLONASE) 50 MCG/ACT nasal spray Place 1 spray into the nose.    Historical Provider, MD  hydrochlorothiazide (HYDRODIURIL) 25 MG tablet Take 1 tablet (25 mg total) by mouth daily. 10/26/14   Nobie Putnam, DO  medroxyPROGESTERone (DEPO-PROVERA) 150 MG/ML injection Inject into the muscle. 06/16/11   Historical Provider, MD  Multiple Vitamin (MULTIVITAMIN WITH MINERALS) TABS tablet Take 1 tablet by mouth daily.    Historical Provider,  MD  valACYclovir (VALTREX) 1000 MG tablet  07/31/14   Historical Provider, MD   BP 112/67 mmHg  Pulse 75  Temp(Src) 98.2 F (36.8 C) (Oral)  Resp 18  SpO2 97% Physical Exam  Constitutional: She is oriented to person, place, and time. She appears well-developed and well-nourished. No distress.  HENT:  Head: Normocephalic and  atraumatic.  Eyes: No scleral icterus.  Neck: Neck supple.  Cardiovascular: Normal rate and regular rhythm.   Pulmonary/Chest: Effort normal. No respiratory distress. She has no wheezes. She has no rales.  Abdominal: Soft. She exhibits no distension. There is tenderness in the suprapubic area and left lower quadrant. There is no rebound, no guarding and no CVA tenderness.  Mild lower abdominal discomfort with palpation.  Musculoskeletal: Normal range of motion.  Neurological: She is alert and oriented to person, place, and time.  Skin: Skin is warm. She is not diaphoretic.  Psychiatric: She has a normal mood and affect. Her behavior is normal.  Nursing note and vitals reviewed.   ED Course  Procedures (including critical care time) Labs Review Labs Reviewed  COMPREHENSIVE METABOLIC PANEL - Abnormal; Notable for the following:    Potassium 3.3 (*)    AST 46 (*)    ALT 43 (*)    GFR calc non Af Amer 71 (*)    GFR calc Af Amer 82 (*)    All other components within normal limits  URINALYSIS, ROUTINE W REFLEX MICROSCOPIC - Abnormal; Notable for the following:    APPearance CLOUDY (*)    Leukocytes, UA MODERATE (*)    All other components within normal limits  CBC WITH DIFFERENTIAL  LIPASE, BLOOD  PREGNANCY, URINE  URINE MICROSCOPIC-ADD ON    Imaging Review No results found.   EKG Interpretation None      MDM   Final diagnoses:  Lower abdominal pain   Patient presents with low abdominal pain and history of diarrhea, or correlation with recent Depo-Provera shot, or diarrhea. Patient is afebrile. Negative pregnancy, UA without infection, electrolytes without concerning normality's. Likely viral or depo induced discomfort. Reevaluation patient resting complain room reports resolution of symptoms times requesting be discharged home and requesting oral intake. Repeat abdominal exam shows minimal lower abdominal discomfort. Plan to treat for pain, follow-up with PCP. Discussed  lab results, and treatment plan with the patient. Return precautions given. Reports understanding and no other concerns at this time.  Patient is stable for discharge at this time. Meds given in ED:  Medications  ketorolac (TORADOL) 30 MG/ML injection 30 mg (30 mg Intravenous Given 11/03/14 1739)  famotidine (PEPCID) tablet 20 mg (20 mg Oral Given 11/03/14 1739)  morphine 4 MG/ML injection 4 mg (4 mg Intravenous Given 11/03/14 1739)    Discharge Medication List as of 11/03/2014  7:12 PM    START taking these medications   Details  famotidine (PEPCID) 20 MG tablet Take 1 tablet (20 mg total) by mouth 2 (two) times daily., Starting 11/03/2014, Until Discontinued, Print    HYDROcodone-acetaminophen (NORCO/VICODIN) 5-325 MG per tablet Take 1 tablet by mouth every 6 (six) hours as needed for moderate pain or severe pain., Starting 11/03/2014, Until Discontinued, Print    ibuprofen (ADVIL,MOTRIN) 800 MG tablet Take 1 tablet (800 mg total) by mouth 3 (three) times daily with meals., Starting 11/03/2014, Until Discontinued, Print         Harvie Heck, PA-C 11/03/14 [place], MD 11/08/14 [phone]

## 2014-11-03 NOTE — Discharge Instructions (Signed)
Call for a follow up appointment with a Family or Primary Care Provider.  °Return if Symptoms worsen.   °Take medication as prescribed.  °Drink plenty of fluids. °

## 2014-11-03 NOTE — ED Notes (Addendum)
Pt states she has had severe lower abdominal pain since yesterday morning. Pt describes the pain as crampy, stabbing. Pt states she feels similar symptoms when she has acid reflux. Pt denies nausea, vomiting, states she had diarrhea twice yesterday.

## 2015-03-21 ENCOUNTER — Ambulatory Visit: Payer: Medicaid Other | Admitting: Family Medicine

## 2015-10-15 ENCOUNTER — Telehealth: Payer: Self-pay | Admitting: Family Medicine

## 2015-10-15 DIAGNOSIS — R21 Rash and other nonspecific skin eruption: Secondary | ICD-10-CM

## 2015-10-15 MED ORDER — CLOBETASOL PROPIONATE 0.05 % EX OINT: 1.0000 "application " | TOPICAL_OINTMENT | Freq: Two times a day (BID) | CUTANEOUS | Status: DC

## 2015-10-15 NOTE — Telephone Encounter (Signed)
Re-ordered Clobetasol cream for rash, use TWICE A DAY for flare then stop, refill given. Last treated 2015. Should follow-up if not improving.  Nobie Putnam, [place], PGY-3

## 2015-10-15 NOTE — Telephone Encounter (Signed)
Needs refill on creme for rash [facility] on [address]

## 2016-02-04 ENCOUNTER — Other Ambulatory Visit: Payer: Self-pay | Admitting: Family Medicine

## 2016-02-04 DIAGNOSIS — I10 Essential (primary) hypertension: Secondary | ICD-10-CM

## 2016-03-04 ENCOUNTER — Other Ambulatory Visit: Payer: Self-pay

## 2016-03-04 DIAGNOSIS — Z1231 Encounter for screening mammogram for malignant neoplasm of breast: Secondary | ICD-10-CM

## 2016-03-09 ENCOUNTER — Encounter: Payer: Self-pay | Admitting: Family Medicine

## 2016-03-09 ENCOUNTER — Ambulatory Visit (INDEPENDENT_AMBULATORY_CARE_PROVIDER_SITE_OTHER): Payer: Medicaid Other | Admitting: Family Medicine

## 2016-03-09 ENCOUNTER — Ambulatory Visit: Payer: Medicaid Other | Admitting: Family Medicine

## 2016-03-09 VITALS — BP 111/63 | HR 80 | Temp 98.6°F | Ht 65.0 in | Wt 131.0 lb

## 2016-03-09 DIAGNOSIS — A6000 Herpesviral infection of urogenital system, unspecified: Secondary | ICD-10-CM | POA: Diagnosis not present

## 2016-03-09 DIAGNOSIS — I10 Essential (primary) hypertension: Secondary | ICD-10-CM

## 2016-03-09 DIAGNOSIS — R0609 Other forms of dyspnea: Secondary | ICD-10-CM | POA: Diagnosis not present

## 2016-03-09 DIAGNOSIS — Z1159 Encounter for screening for other viral diseases: Secondary | ICD-10-CM

## 2016-03-09 DIAGNOSIS — E785 Hyperlipidemia, unspecified: Secondary | ICD-10-CM

## 2016-03-09 DIAGNOSIS — D497 Neoplasm of unspecified behavior of endocrine glands and other parts of nervous system: Secondary | ICD-10-CM

## 2016-03-09 DIAGNOSIS — G44229 Chronic tension-type headache, not intractable: Secondary | ICD-10-CM | POA: Diagnosis not present

## 2016-03-09 MED ORDER — AMITRIPTYLINE HCL 25 MG PO TABS: 25.0000 mg | ORAL_TABLET | Freq: Every day | ORAL | Status: DC

## 2016-03-09 MED ORDER — VALACYCLOVIR HCL 1 G PO TABS: 1000.0000 mg | ORAL_TABLET | Freq: Every day | ORAL | Status: DC

## 2016-03-09 NOTE — Assessment & Plan Note (Signed)
Last lipids 2015, elevated LDL. Given HTN will repeat fasting lipids. - Future fasting lipid panel ordered

## 2016-03-09 NOTE — Assessment & Plan Note (Addendum)
Stable, chronic s/p resection 0000000, complication with secondary chronic Left sided headaches, likely worsening her Right tension headaches.  Plan: 1. Follow-up with [facility] as scheduled for yearly monitoring for recurrence, MRIs - next April-May 2017, ask about treatments for chronic headaches

## 2016-03-09 NOTE — Assessment & Plan Note (Signed)
Controlled on suppression therapy - refilled valtrex 1000mg  daily

## 2016-03-09 NOTE — Assessment & Plan Note (Signed)
>>  ASSESSMENT AND PLAN FOR MENINGEAL TUMOR WRITTEN ON 03/09/2016  9:44 AM BY KARAMALEGOS, ALEXANDER J, DO  Stable, chronic s/p resection 2012, complication with secondary chronic Left sided headaches, likely worsening her Right tension headaches.  Plan: 1. Follow-up with [facility] as scheduled for yearly monitoring for recurrence, MRIs - next April-May 2017, ask about treatments for chronic headaches

## 2016-03-09 NOTE — Assessment & Plan Note (Signed)
Consistent with chronic Right sided tension headaches, likely multifactorial with rebound headaches now on daily excedrin, stress/inc work/school, chronic Left sided post-op headaches, decreased sleep, neck/back muscle spasms and pain - No h/o migraine, no associated symptoms or other clear triggers  Plan: 1. Goal to wean off of Excedrin migraine (2 tabs daily > off over 2 weeks, taper by half every 3-5 days), may try once daily Tylenol / ibuprofen as needed ONLY if severe headache 2. Start trial Amitriptyline 25mg  nightly (start 12.5mg  half daily x 3 days) for chronic headache, insomnia, s/p post-op pain headache 3. Avoid triggers - No caffeine 4. Use heating pad, muscle rub on neck/upper back 5. Consider HA log for more details in future 6. RTC 4 weeks

## 2016-03-09 NOTE — Progress Notes (Signed)
Subjective:    Patient ID: Cynthia Orozco, female    DOB: [birth year], 55 y.o.   MRN: [id number]  Follow-up BP, Headaches, Refill.  HPI   HEADACHES, chronic: - Reports known chronic history of Left sided headaches associated with numbness s/p Left Sphenoid Meningeoma surgical resection in 2012 ([facility]), these headaches have not improved or worsened. Due for next follow-up at [facility] April-May 2017 for annual MRI to follow-up any tumor recurrence, not treated for headaches or pain by neurosurgery. 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 No other clear provoking factors. Improves with rest and sleep. - Tried taking Advil / Aleve regular without any significant relief had to take "too many" to be useful, then switched to Excedrin Migraine 1-2 tabs most every day. Has been taking this every day for past 18 weeks - Drinks occasional caffeine maybe 1 cup tea or soda (not daily) - No history of migraine HAs - Admits difficulty sleeping at night, chronic loss vision Left eye - Denies any fevers/chills, nausea, vomiting, reduced appetite, dizziness, vertigo, new loss of vision, numbness, tingling, weakness  CHRONIC HTN: Reports - no concerns, not checking BP outside office Current Meds - HCTZ 25mg  daily   Reports good compliance, took meds today. Tolerating well, w/o complaints. Denies CP, dyspnea, edema, lightheadedness  FATIGUE / EXERTIONAL DYSPNEA: - Patient also reports history of recent past several months with feeling more fatigued and "tired", seems to be since the onset of the tension headaches. Describes some episodes of exertional chest pain and shortness of breath when climbing 3 flights of stairs, usually when carrying her cosmetology suitcase, or active  around the house with vacuuming. Tried a MVI without improved energy. Admits to poor sleep. - Denies history of MI or cardiac history. Family history of cardiac disease and diabetes, Grandmother passed MI age 12, and Father passed complications of multiple MIs age >25 - Active smoker - Denies active chest pain or dyspnea, cough, syncope or pre-syncope, diaphoresis  GENITAL HERPES, suppressed: - Request refill on Valtrex 1000mg  daily, has been suppressed well on this medication, previously on half tab 500mg  daily, but advised by GYN to increase to 1000mg  daily. Last outbreak several months ago. Currently about to run out of this rx. No current symptoms or ash.   Social History  Substance Use Topics  . Smoking status: Current Some Day Smoker    Types: Cigars  . Smokeless tobacco: None     Comment: Black and mild once a week  . Alcohol Use: Yes     Comment: occasionally   Review of Systems  See above HPI    Objective:   Physical Exam  Constitutional: She appears well-developed and well-nourished. No distress.  Well-appearing, cooperative, mild discomfort with headache  HENT:  Head: Normocephalic and atraumatic.  Mouth/Throat: Oropharynx is clear and moist.  Frontal sinuses non-tender to palpation, patent nares, TM's clear without erythema, effusion or bulging.  Eyes: Conjunctivae and EOM are normal. Pupils are equal, round, and reactive to light.  Left eye ptosis, chronic  Neck: Normal range of motion. Neck supple. No thyromegaly present.  Bilateral neck paraspinal hypertonicity, into upper trapezius bilateral  Cardiovascular: Normal rate, regular rhythm, normal heart sounds and intact distal pulses.   No murmur heard. Pulmonary/Chest: Effort normal and breath sounds normal. No respiratory distress.  She has no wheezes. She has no rales. She exhibits no tenderness.  Musculoskeletal: She exhibits no edema.  Upper back / neck Inspection: symmetrical Palpation: mild trapezius  hypertonicity bilaterally from neck into both shoulders, no obvious trigger points ROM: reduced forward flexion left shoulder forward flexion Strength: grip 5/5 intact bilateral Neurovascular: distally intact   Lymphadenopathy:    She has no cervical adenopathy.  Neurological: She is alert.  chronic decreased sensation to light touch along left head / face s/p surgery  Skin: Skin is warm and dry. No rash noted. She is not diaphoretic.  Psychiatric: She has a normal mood and affect. Her behavior is normal.  Nursing note and vitals reviewed.   BP 111/63 mmHg  Pulse 80  Temp(Src) 98.6 F (37 C) (Oral)  Ht 5\' 5"  (1.651 m)  Wt 131 lb (59.421 kg)  BMI 21.80 kg/m2      Assessment & Plan:   Problem List Items Addressed This Visit    Chronic tension headaches - Primary    Consistent with chronic Right sided tension headaches, likely multifactorial with rebound headaches now on daily excedrin, stress/inc work/school, chronic Left sided post-op headaches, decreased sleep, neck/back muscle spasms and pain - No h/o migraine, no associated symptoms or other clear triggers  Plan: 1. Goal to wean off of Excedrin migraine (2 tabs daily > off over 2 weeks, taper by half every 3-5 days), may try once daily Tylenol / ibuprofen as needed ONLY if severe headache 2. Start trial Amitriptyline 25mg  nightly (start 12.5mg  half daily x 3 days) for chronic headache, insomnia, s/p post-op pain headache 3. Avoid triggers - No caffeine 4. Use heating pad, muscle rub on neck/upper back 5. Consider HA log for more details in future 6. RTC 4 weeks      Relevant Medications   amitriptyline (ELAVIL) 25 MG tablet   Essential hypertension, benign    Well controlled No complications  Plan: 1. Continue HCTZ 25mg  daily 2. Future labs ordered - CMET, Lipids, A1c 3. Follow-up      Relevant Orders   COMPLETE METABOLIC PANEL WITH GFR   POCT glycosylated hemoglobin (Hb A1C)   Exertional dyspnea    Seems to  be related to worsening headaches over several months. No significant cardiac history, however risk factors with active smoker, HTN, elevated lipids. Family history of MI/cardiac disease but not age < 41. - No active CP or symptoms today  Plan: 1. Address underlying headaches 2. Counseled on avoiding significant exertion for now 3. Ordered labs CMET, Lipids, A1c 4. Follow-up 4 weeks, with strict return criteria given for persistent chest pain / dyspnea. Consider future cardiac referral for further work-up and maybe stress test      Genital herpes simplex type 2    Controlled on suppression therapy - refilled valtrex 1000mg  daily      Relevant Medications   valACYclovir (VALTREX) 1000 MG tablet   Hyperlipidemia    Last lipids 2015, elevated LDL. Given HTN will repeat fasting lipids. - Future fasting lipid panel ordered      Relevant Orders   LDL cholesterol, direct   Meningeal tumor (HCC)    Stable, chronic s/p resection 0000000, complication with secondary chronic Left sided headaches, likely worsening her Right tension headaches.  Plan: 1. Follow-up with [facility] as scheduled for yearly monitoring for recurrence, MRIs - next April-May 2017, ask about treatments for chronic headaches       Other Visit Diagnoses    Need for hepatitis C screening  test        Relevant Orders    Hepatitis C antibody      Return in about 4 weeks (around 04/06/2016) for headaches, left arm pain.  Nobie Putnam, [place], PGY-3

## 2016-03-09 NOTE — Assessment & Plan Note (Signed)
Well controlled No complications  Plan: 1. Continue HCTZ 25mg  daily 2. Future labs ordered - CMET, Lipids, A1c 3. Follow-up

## 2016-03-09 NOTE — Assessment & Plan Note (Signed)
Seems to be related to worsening headaches over several months. No significant cardiac history, however risk factors with active smoker, HTN, elevated lipids. Family history of MI/cardiac disease but not age < 38. - No active CP or symptoms today  Plan: 1. Address underlying headaches 2. Counseled on avoiding significant exertion for now 3. Ordered labs CMET, Lipids, A1c 4. Follow-up 4 weeks, with strict return criteria given for persistent chest pain / dyspnea. Consider future cardiac referral for further work-up and maybe stress test

## 2016-03-09 NOTE — Patient Instructions (Signed)
Thank you for coming in to clinic today.  1. For your Right sided headaches, these are most likely tension headaches. Stress, difficulty sleeping, your left sided chronic headaches, muscle tension and spasm all contribute to these. - Also rebound headaches from medicines (excedrin) can make these worse - Recommend gradually come off the Excredin completely within next 2 weeks, start 1 tab in AM and half tab PM, for 3-5 days, then 1 tab daily for 3-5 days, then half tab as needed for several days then stop completely. You may switch to regular Tylenol 500-1000mg  or Ibuprofen 400-600mg  per dose only once a day if absolutely needed (try to avoid this if you can) - Start new headache med, Amitriptyline 25mg  tabs (cut these in half for first 3 days, then take a whole tablet) take at bedtime - Also for your muscle tension in shoulders and neck, use a heating pad or moist warm towel  For Left sided chronic headaches post-op, recommend asking your Neurosurgery doctors about ways to treat these headaches in future.  BP is well controlled today  Refilled Valtrex, take one tab 1000mg  daily  Please schedule a follow-up appointment with Dr Parks Ranger in 4 weeks to follow-up Headaches and Left Arm Pain  If you have any other questions or concerns, please feel free to call the clinic to contact me. You may also schedule an earlier appointment if necessary.  However, if your symptoms get significantly worse, please go to the Emergency Department to seek immediate medical attention.  Nobie Putnam, [place]

## 2016-03-13 ENCOUNTER — Ambulatory Visit
Admission: RE | Admit: 2016-03-13 | Discharge: 2016-03-13 | Disposition: A | Discharge status: Home / Self Care | Payer: Medicaid Other | Source: Ambulatory Visit

## 2016-03-13 DIAGNOSIS — Z1231 Encounter for screening mammogram for malignant neoplasm of breast: Secondary | ICD-10-CM

## 2016-03-16 ENCOUNTER — Ambulatory Visit: Payer: Medicaid Other | Admitting: Family Medicine

## 2016-06-05 ENCOUNTER — Encounter (HOSPITAL_COMMUNITY): Payer: Self-pay | Admitting: *Deleted

## 2016-06-05 ENCOUNTER — Emergency Department (HOSPITAL_COMMUNITY)
Admission: EM | Admit: 2016-06-05 | Discharge: 2016-06-05 | Disposition: A | Discharge status: Home / Self Care | Payer: Medicaid Other | Attending: Emergency Medicine | Admitting: Emergency Medicine

## 2016-06-05 DIAGNOSIS — I10 Essential (primary) hypertension: Secondary | ICD-10-CM | POA: Diagnosis not present

## 2016-06-05 DIAGNOSIS — F1290 Cannabis use, unspecified, uncomplicated: Secondary | ICD-10-CM | POA: Diagnosis not present

## 2016-06-05 DIAGNOSIS — Z85841 Personal history of malignant neoplasm of brain: Secondary | ICD-10-CM | POA: Insufficient documentation

## 2016-06-05 DIAGNOSIS — Z79899 Other long term (current) drug therapy: Secondary | ICD-10-CM | POA: Diagnosis not present

## 2016-06-05 DIAGNOSIS — F17210 Nicotine dependence, cigarettes, uncomplicated: Secondary | ICD-10-CM | POA: Diagnosis not present

## 2016-06-05 DIAGNOSIS — E785 Hyperlipidemia, unspecified: Secondary | ICD-10-CM | POA: Diagnosis not present

## 2016-06-05 DIAGNOSIS — B029 Zoster without complications: Secondary | ICD-10-CM | POA: Diagnosis not present

## 2016-06-05 DIAGNOSIS — F1490 Cocaine use, unspecified, uncomplicated: Secondary | ICD-10-CM | POA: Insufficient documentation

## 2016-06-05 DIAGNOSIS — R21 Rash and other nonspecific skin eruption: Secondary | ICD-10-CM | POA: Diagnosis present

## 2016-06-05 MED ORDER — ONDANSETRON 4 MG PO TBDP
4.0000 mg | ORAL_TABLET | Freq: Once | ORAL | Status: AC
  Administered 2016-06-05: 4 mg via ORAL
  Filled 2016-06-05: qty 1

## 2016-06-05 MED ORDER — GABAPENTIN 300 MG PO CAPS
600.0000 mg | ORAL_CAPSULE | Freq: Once | ORAL | Status: AC
  Administered 2016-06-05: 600 mg via ORAL
  Filled 2016-06-05: qty 2

## 2016-06-05 MED ORDER — GABAPENTIN 100 MG PO CAPS: 100.0000 mg | ORAL_CAPSULE | Freq: Three times a day (TID) | ORAL | Status: DC | PRN

## 2016-06-05 MED ORDER — VALACYCLOVIR HCL 1 G PO TABS: 1000.0000 mg | ORAL_TABLET | Freq: Three times a day (TID) | ORAL | Status: AC

## 2016-06-05 MED ORDER — OXYCODONE-ACETAMINOPHEN 5-325 MG PO TABS
2.0000 | ORAL_TABLET | Freq: Once | ORAL | Status: AC
  Administered 2016-06-05: 2 via ORAL
  Filled 2016-06-05: qty 2

## 2016-06-05 MED ORDER — OXYCODONE-ACETAMINOPHEN 5-325 MG PO TABS: 2.0000 | ORAL_TABLET | ORAL | Status: DC | PRN

## 2016-06-05 MED ORDER — VALACYCLOVIR HCL 500 MG PO TABS
1000.0000 mg | ORAL_TABLET | Freq: Once | ORAL | Status: AC
  Administered 2016-06-05: 1000 mg via ORAL
  Filled 2016-06-05: qty 2

## 2016-06-05 MED ORDER — PREDNISONE 20 MG PO TABS
60.0000 mg | ORAL_TABLET | Freq: Once | ORAL | Status: AC
  Administered 2016-06-05: 60 mg via ORAL
  Filled 2016-06-05: qty 3

## 2016-06-05 NOTE — ED Notes (Signed)
Pt complains of itching, painful, raised red area to mid back around her bra line since yesterday. Pt states she tried putting anti-itch cream on her back, which she states helped temporarily. Pt states she is unsure if she was bitten by an insect.

## 2016-06-05 NOTE — ED Provider Notes (Signed)
CSN: [id number]     Arrival date & time 06/05/16  1328 History   First MD Initiated Contact with Patient 06/05/16 1424     Chief Complaint  Patient presents with  . Rash     HPI  Patient resisted dilation of a painful itching rash on her right back under her bra line. Symptoms for 24 hours. No bites. No additional areas of rash. Well localized limited painful.  Patient did have chickenpox as a child. She is on Valtrex and 50 mg daily for genital herpes prophylaxis  Past Medical History  Diagnosis Date  . Allergy   . Anemia   . Hypertension   . Hyperlipidemia   . Meningioma [facility]) July 2012    primary osseus Chordoid meningioma of left sphenoid. followed at [facility] by Dr. Redmond Pulling  . Blindness of left eye     s/p resection of sphenoid meningioma   Past Surgical History  Procedure Laterality Date  . Cyst removal hand Left 2011  . Tumor removal Left July 18th 2012    left sphenoid primary osseus meningioma   Family History  Problem Relation Age of Onset  . Diabetes Mother   . Dementia Maternal Grandmother    Social History  Substance Use Topics  . Smoking status: Current Some Day Smoker    Types: Cigars  . Smokeless tobacco: None     Comment: Black and mild once a week  . Alcohol Use: Yes     Comment: occasionally   OB History    No data available     Review of Systems  Constitutional: Negative for fever, chills, diaphoresis, appetite change and fatigue.  HENT: Negative for mouth sores, sore throat and trouble swallowing.   Eyes: Negative for visual disturbance.  Respiratory: Negative for cough, chest tightness, shortness of breath and wheezing.   Cardiovascular: Negative for chest pain.  Gastrointestinal: Negative for nausea, vomiting, abdominal pain, diarrhea and abdominal distention.  Endocrine: Negative for polydipsia, polyphagia and polyuria.  Genitourinary: Negative for dysuria, frequency and hematuria.  Musculoskeletal: Negative for gait problem.  Skin:  Positive for rash. Negative for color change and pallor.  Neurological: Negative for dizziness, syncope, light-headedness and headaches.  Hematological: Does not bruise/bleed easily.  Psychiatric/Behavioral: Negative for behavioral problems and confusion.      Allergies  Apple; Grape extract (vitis vinifera); Pineapple; Strawberry extract; Latex; and Other  Home Medications   Prior to Admission medications   Medication Sig Start Date End Date Taking? Authorizing Provider  amitriptyline (ELAVIL) 25 MG tablet Take 1 tablet (25 mg total) by mouth at bedtime. 03/09/16   Olin Hauser, DO  calcium carbonate (CALCIUM 600) 600 MG TABS tablet Take 600 mg by mouth.    Historical Provider, MD  clobetasol ointment (TEMOVATE) [id number] % Apply 1 application topically 2 (two) times daily. 10/15/15   Olin Hauser, DO  famotidine (PEPCID) 20 MG tablet Take 1 tablet (20 mg total) by mouth 2 (two) times daily. 11/03/14   Harvie Heck, PA-C  fish oil-omega-3 fatty acids 1000 MG capsule Take 1 g by mouth every morning.     Historical Provider, MD  fluticasone (FLONASE) 50 MCG/ACT nasal spray Place 1 spray into both nostrils daily.     Historical Provider, MD  gabapentin (NEURONTIN) 100 MG capsule Take 1 capsule (100 mg total) by mouth 3 (three) times daily as needed. 06/05/16   Tanna Furry, MD  hydrochlorothiazide (HYDRODIURIL) 25 MG tablet take 1 tablet by mouth once daily 02/05/16  Olin Hauser, DO  medroxyPROGESTERone (DEPO-PROVERA) 150 MG/ML injection Inject into the muscle. 06/16/11   Historical Provider, MD  Multiple Vitamin (MULTIVITAMIN WITH MINERALS) TABS tablet Take 1 tablet by mouth daily.    Historical Provider, MD  oxyCODONE-acetaminophen (PERCOCET/ROXICET) 5-325 MG tablet Take 2 tablets by mouth every 4 (four) hours as needed. 06/05/16   Tanna Furry, MD  valACYclovir (VALTREX) 1000 MG tablet Take 1 tablet (1,000 mg total) by mouth 3 (three) times daily. 06/05/16 06/19/16  Tanna Furry, MD   BP 136/93 mmHg  Pulse 66  Temp(Src) 98.5 F (36.9 C) (Oral)  Resp 18  SpO2 97% Physical Exam  Constitutional: She is oriented to person, place, and time. She appears well-developed and well-nourished. No distress.  HENT:  Head: Normocephalic.  Eyes: Conjunctivae are normal. Pupils are equal, round, and reactive to light. No scleral icterus.  Neck: Normal range of motion. Neck supple. No thyromegaly present.  Cardiovascular: Normal rate and regular rhythm.  Exam reveals no gallop and no friction rub.   No murmur heard. Pulmonary/Chest: Effort normal and breath sounds normal. No respiratory distress. She has no wheezes. She has no rales.    Abdominal: Soft. Bowel sounds are normal. She exhibits no distension. There is no tenderness. There is no rebound.  Musculoskeletal: Normal range of motion.  Neurological: She is alert and oriented to person, place, and time.  Skin: Skin is warm and dry. No rash noted.  Psychiatric: She has a normal mood and affect. Her behavior is normal.    ED Course  Procedures (including critical care time) Labs Review Labs Reviewed - No data to display  Imaging Review No results found. I have personally reviewed and evaluated these images and lab results as part of my medical decision-making.   EKG Interpretation None      MDM   Final diagnoses:  Shingles   Prescriptions Percocet, Valtrex, Neurontin. Given information regarding care and treatment.    Tanna Furry, MD 06/05/16 [phone]

## 2016-06-05 NOTE — Discharge Instructions (Signed)

## 2016-06-29 ENCOUNTER — Ambulatory Visit (INDEPENDENT_AMBULATORY_CARE_PROVIDER_SITE_OTHER): Payer: Medicaid Other | Admitting: Internal Medicine

## 2016-06-29 ENCOUNTER — Encounter: Payer: Self-pay | Admitting: Internal Medicine

## 2016-06-29 VITALS — BP 125/78 | HR 58 | Temp 98.8°F | Ht 65.0 in | Wt 128.8 lb

## 2016-06-29 DIAGNOSIS — H109 Unspecified conjunctivitis: Secondary | ICD-10-CM | POA: Insufficient documentation

## 2016-06-29 MED ORDER — OLOPATADINE HCL 0.2 % OP SOLN: 1.0000 [drp] | Freq: Every day | OPHTHALMIC | Status: DC

## 2016-06-29 NOTE — Patient Instructions (Signed)
Use the eye drops in left eye once daily. If you start having significant amounts of yellow thick drainage that comes back immediately after being wiped away you need to be seen again. If you eyelid becomes more swollen, becomes bright red, warm to the touch, or is more painful to touch that usual you should be seen again.   Feel better,   Dr. Juleen China

## 2016-06-29 NOTE — Progress Notes (Signed)
   Subjective:    Cynthia Orozco - 55 y.o. female MRN [id number]  Date of birth: 16-Jul-1961  HPI  Cynthia Orozco is here for SDA for eye redness/swelling.  Eye Redness/Swelling: Left eye swollen for the past couple days. Also has noticed some eye redness. It has remained unchanged for several days. Is blind in her left eye s/p Sphenoid tumor removal in 2012. Has chronic pain on left side of forehead and in orbital area since surgery. Is unable to make tears in her left eye. Says for the past couple of days has noticed creamy yellow looking discharge in the corners of her eye. When she wipes it away, it will take hours later for it to come back. Usually present in the morning and then at night prior to bedtime. Does have a history of seasonal allergies so she has rhinorrhea and dry cough at baseline. Using Flonase at home.  No contacts with red eyes.   Diagnosed with Shingles on 6/23. Shingles present on her back. Was prescribed Valacyclovir which she has been taking TID    -  reports that she has been smoking Cigars.  She does not have any smokeless tobacco history on file. - Review of Systems: Per HPI. - Past Medical History: Patient Active Problem List   Diagnosis Date Noted  . Conjunctivitis 06/29/2016  . Chronic tension headaches 03/09/2016  . Hyperlipidemia 03/09/2016  . Exertional dyspnea 03/09/2016  . Genital herpes simplex type 2 10/26/2014  . 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 Rash and nonspecific skin eruption 01/11/2014  . Essential hypertension, benign 01/06/2014  . Benign neoplasm of meninges ([place]) 10/27/2012  . H/O surgical procedure 04/14/2012  . Meningeal tumor ([place]) 10/08/2011   - Medications: reviewed and updated    Objective:   Physical Exam BP 125/78 mmHg  Pulse 58  Temp(Src) 98.8 F (37.1 C) (Oral)  Ht 5\' 5"  (1.651 m)  Wt 128 lb 12.8 oz (58.423 kg)  BMI 21.43 kg/m2 Gen: NAD, alert, cooperative with exam, well-appearing HEENT: NCAT, very mild  scleral injection in outer corner of left eye, left upper eyelid mildly swollen but non-erythematous, without induration, and without increased warmth to touch, eyelid is TTP, turbinates swollen bilaterally, throat erythematous without tonsillar cobblestoning  CV: RRR, good S1/S2, no murmur, no edema, capillary refill brisk  Resp: CTABL, no wheezes, non-labored Skin: No vesicles or rash present on face.   Assessment & Plan:   Conjunctivitis Presentation and history consistent with a viral or allergic conjunctivitis. Given history of significant allergic rhinitis, will treat as an allergic conjunctivitis. Low suspicion for orbital involvement of shingles given lack of rash or vesicles on face. Low suspicion for orbital cellulitis given lack of significant exam findings (no induration, redness, or increased warmth). Patient did have some TTP over left eye although this appears to be consistent with her baseline s/p surgery.  -Rx for Pataday  -continue with Flonase  -return precautions for bacterial conjunctivitis and orbital cellulitis given      Phill Myron, D.O. 06/29/2016, 10:50 AM PGY-2, [place]

## 2016-06-29 NOTE — Assessment & Plan Note (Signed)
Presentation and history consistent with a viral or allergic conjunctivitis. Given history of significant allergic rhinitis, will treat as an allergic conjunctivitis. Low suspicion for orbital involvement of shingles given lack of rash or vesicles on face. Low suspicion for orbital cellulitis given lack of significant exam findings (no induration, redness, or increased warmth). Patient did have some TTP over left eye although this appears to be consistent with her baseline s/p surgery.  -Rx for Pataday  -continue with Flonase  -return precautions for bacterial conjunctivitis and orbital cellulitis given

## 2016-09-14 ENCOUNTER — Other Ambulatory Visit: Payer: Self-pay | Admitting: Family Medicine

## 2016-09-14 DIAGNOSIS — I10 Essential (primary) hypertension: Secondary | ICD-10-CM

## 2016-10-05 ENCOUNTER — Ambulatory Visit (INDEPENDENT_AMBULATORY_CARE_PROVIDER_SITE_OTHER): Payer: Medicaid Other | Admitting: Internal Medicine

## 2016-10-05 ENCOUNTER — Encounter: Payer: Self-pay | Admitting: Internal Medicine

## 2016-10-05 VITALS — BP 120/79 | HR 87 | Temp 98.6°F | Wt 131.0 lb

## 2016-10-05 DIAGNOSIS — R062 Wheezing: Secondary | ICD-10-CM | POA: Diagnosis not present

## 2016-10-05 DIAGNOSIS — J302 Other seasonal allergic rhinitis: Secondary | ICD-10-CM | POA: Diagnosis not present

## 2016-10-05 DIAGNOSIS — Z111 Encounter for screening for respiratory tuberculosis: Secondary | ICD-10-CM

## 2016-10-05 MED ORDER — FLUTICASONE PROPIONATE 50 MCG/ACT NA SUSP: 2.0000 | Freq: Every day | NASAL | 6 refills | Status: DC

## 2016-10-05 MED ORDER — ALBUTEROL SULFATE HFA 108 (90 BASE) MCG/ACT IN AERS: 2.0000 | INHALATION_SPRAY | Freq: Four times a day (QID) | RESPIRATORY_TRACT | 1 refills | Status: DC | PRN

## 2016-10-05 MED ORDER — CETIRIZINE HCL 10 MG PO TABS: 10.0000 mg | ORAL_TABLET | Freq: Every day | ORAL | 11 refills | Status: DC

## 2016-10-05 NOTE — Assessment & Plan Note (Signed)
Likely cause of chronic nasal congestion with changing seasons. Symptoms resolve with Flonase, however patient with difficulty affording OTC unless generic is available.  - Prescription for fluticasone - Will also begin cetirizine to see if symptoms improve - Albuterol inhaler for wheezing

## 2016-10-05 NOTE — Patient Instructions (Signed)
It was nice meeting you today Ms. Bin!  Please begin taking cetirizine (Zyrtec) daily. This should help prevent any allergy symptoms. When necessary, you can use the Flonase nasal spray.   You can use your albuterol inhaler when you are wheezing or if you are having trouble breathing. Inhale two puffs every 4-6 hours as needed.   If you have any questions or concerns, please feel free to call the clinic.   Be well,  Dr. Avon Gully

## 2016-10-05 NOTE — Progress Notes (Signed)
   Subjective:    Patient ID: Cynthia Orozco, female    DOB: 1961/05/23, 55 y.o.   MRN: [id number]  HPI  Patient presents for allergy symptoms.   Seasonal allergies Patient reports nasal congestion, particularly when seasons are changing. Says that her symptoms resolve with Flonase, however she has not been able to afford it recently. She has been using nasal saline instead, which helps somewhat, but not as well as Flonase. She is hoping for a Flonase prescription which may hopefully be more affordable. She has never taken a daily medication for allergies, but did see an allergist a while ago and was receiving shots. She is unsure what the shots were for.  Also reports wheezing at times. Denies difficulty breathing. Says she has used her boyfriend's albuterol inhaler with improvement in symptoms. Has never been formerly diagnosed with asthma but has strong family history of asthma.   Smoking status reviewed.   Review of Systems See HPI.     Objective:   Physical Exam  Constitutional: She is oriented to person, place, and time. She appears well-developed and well-nourished. No distress.  HENT:  Head: Normocephalic and atraumatic.  Eyes: EOM are normal.  Pulmonary/Chest: Effort normal. No respiratory distress. She has wheezes (R>L, faint). She has no rales.  Neurological: She is alert and oriented to person, place, and time.  Psychiatric: She has a normal mood and affect. Her behavior is normal.      Assessment & Plan:  Seasonal allergies Likely cause of chronic nasal congestion with changing seasons. Symptoms resolve with Flonase, however patient with difficulty affording OTC unless generic is available.  - Prescription for fluticasone - Will also begin cetirizine to see if symptoms improve - Albuterol inhaler for wheezing  Wheezing Asthma vs allergic component. Strong family history of asthma, and patient not previously formerly assessed. No SOB, and normal WOB on RA. Only faint  wheezing, R>L. Has had symptomatic improvement with albuterol in past.  - Prescribed albuterol inhaler today  Adin Hector, MD, MPH PGY-2 [facility] Pager [phone]

## 2016-10-05 NOTE — Assessment & Plan Note (Signed)
Asthma vs allergic component. Strong family history of asthma, and patient not previously formerly assessed. No SOB, and normal WOB on RA. Only faint wheezing, R>L. Has had symptomatic improvement with albuterol in past.  - Prescribed albuterol inhaler today

## 2016-10-08 ENCOUNTER — Encounter: Payer: Self-pay | Admitting: *Deleted

## 2016-10-08 NOTE — Progress Notes (Signed)
Pt arrived @ 3:56 on 10/08/16 for a PPD read.  I was in with 2 other patients giving flu shots when pt arrived.  These pts arrived @ 3:50pm so I was not at a computer when Mrs. Guzzi checked in.  When I returned to the computer to check my schedule, I looked up the date and time of PPD placement.  Unfortunately, by that time her PPD window had passed (it needed to be read by 4:09pm today).  Went to go explain to pt, but she had already left. Had [facility] cancel appt.  Will explain to pt if she calls or stops by tomorrow.  Tonee Silverstein, Salome Spotted, CMA

## 2016-10-15 ENCOUNTER — Other Ambulatory Visit: Payer: Self-pay | Admitting: Family Medicine

## 2016-10-15 DIAGNOSIS — I10 Essential (primary) hypertension: Secondary | ICD-10-CM

## 2016-10-26 ENCOUNTER — Ambulatory Visit: Payer: Self-pay

## 2016-10-28 ENCOUNTER — Ambulatory Visit (INDEPENDENT_AMBULATORY_CARE_PROVIDER_SITE_OTHER): Payer: Medicaid Other | Admitting: *Deleted

## 2016-10-28 DIAGNOSIS — Z111 Encounter for screening for respiratory tuberculosis: Secondary | ICD-10-CM | POA: Diagnosis present

## 2016-10-28 NOTE — Progress Notes (Signed)
   PPD placed Left Forearm.  Pt to return 10/30/2016 for reading.  Pt tolerated intradermal injection. Derl Barrow, RN

## 2016-10-30 ENCOUNTER — Ambulatory Visit (INDEPENDENT_AMBULATORY_CARE_PROVIDER_SITE_OTHER): Payer: Medicaid Other | Admitting: *Deleted

## 2016-10-30 ENCOUNTER — Encounter: Payer: Self-pay | Admitting: *Deleted

## 2016-10-30 ENCOUNTER — Ambulatory Visit: Payer: Self-pay

## 2016-10-30 DIAGNOSIS — Z111 Encounter for screening for respiratory tuberculosis: Secondary | ICD-10-CM

## 2016-10-30 LAB — TB SKIN TEST
INDURATION: 0 mm
TB Skin Test: NEGATIVE

## 2016-10-30 NOTE — Progress Notes (Signed)
   PPD Reading Note PPD read and results entered in EpicCare. Result: 0 mm induration. Interpretation: Negative If test not read within 48-72 hours of initial placement, patient advised to repeat in other arm 1-3 weeks after this test. Allergic reaction: no  Kramer Hanrahan L, RN  

## 2016-11-04 ENCOUNTER — Other Ambulatory Visit: Payer: Self-pay | Admitting: Internal Medicine

## 2016-11-04 DIAGNOSIS — I10 Essential (primary) hypertension: Secondary | ICD-10-CM

## 2016-12-21 ENCOUNTER — Ambulatory Visit (INDEPENDENT_AMBULATORY_CARE_PROVIDER_SITE_OTHER): Payer: Medicaid Other | Admitting: Internal Medicine

## 2016-12-21 ENCOUNTER — Encounter: Payer: Self-pay | Admitting: Internal Medicine

## 2016-12-21 VITALS — BP 150/96 | HR 98 | Temp 99.0°F | Wt 136.0 lb

## 2016-12-21 DIAGNOSIS — G44229 Chronic tension-type headache, not intractable: Secondary | ICD-10-CM | POA: Diagnosis not present

## 2016-12-21 DIAGNOSIS — E669 Obesity, unspecified: Secondary | ICD-10-CM

## 2016-12-21 DIAGNOSIS — Z0000 Encounter for general adult medical examination without abnormal findings: Secondary | ICD-10-CM | POA: Diagnosis not present

## 2016-12-21 DIAGNOSIS — E785 Hyperlipidemia, unspecified: Secondary | ICD-10-CM | POA: Diagnosis not present

## 2016-12-21 DIAGNOSIS — I10 Essential (primary) hypertension: Secondary | ICD-10-CM | POA: Diagnosis not present

## 2016-12-21 LAB — POCT GLYCOSYLATED HEMOGLOBIN (HGB A1C): Hemoglobin A1C: 5

## 2016-12-21 MED ORDER — AMITRIPTYLINE HCL 25 MG PO TABS: 25.0000 mg | ORAL_TABLET | Freq: Every day | ORAL | 3 refills | Status: DC

## 2016-12-21 MED ORDER — HYDROCHLOROTHIAZIDE 25 MG PO TABS: 25.0000 mg | ORAL_TABLET | Freq: Every day | ORAL | 3 refills | Status: DC

## 2016-12-21 NOTE — Assessment & Plan Note (Signed)
Likely worsening recently due to uncontrolled HTN as well as patient being out of daily meds. No signs of neuro deficits.  - Refilled Elavil - Discussed that better BP control will help with headaches

## 2016-12-21 NOTE — Assessment & Plan Note (Signed)
Due for lipid panel. Patient not fasting, so will get direct LDL only today.

## 2016-12-21 NOTE — Patient Instructions (Addendum)
It was nice seeing you today Ms. Speciale!  Please start taking your blood pressure medication (hydrochlorothiazide) again. I have also refilled your headache medication (Elavil or amitriptyline) so you can start taking that as well. You can also continue to take ibuprofen or Tylenol for headaches.   We will see you back in one year, or sooner if you need [place]. I will let you know if there are any abnormalities in your bloodwork.   If you have any questions or concerns, please feel free to call the clinic.   Be well,  Dr. Avon Gully

## 2016-12-21 NOTE — Progress Notes (Signed)
55 y.o. year old female presents for well woman/preventative visit and annual GYN examination.  Acute Concerns:  Headaches: Has been out of Elavil for the past week. Headaches on both side of head now. Ibuprofen helps for a bit. No numbness, tingling, weakness. Is not taking gabapentin. No vision changes. No nausea or vomiting.   HTN: Has been without medication for a while (at least two months). Keeps forgetting to pick up medications. Checks at pharmacy occasionally. Said the last time she checked the number was very high, but she cannot remember what it was. Endorses headaches (see above). Denies changes in vision.   Diet: Thinks well-balanced diet. Does not eat fast food. Drinks water, juice. Does not drink soda often. Drinks wine once or twice a week.   Exercise: Walks daily when warmer weather, about 2-3 miles. Does at home exercises during the winter daily.    Sexual/Birth History: G3P3. Sexually active currently. Does not use protection because with one person. Still getting Depo Provera even though she is post-menopausal because she is worried about getting pregnant.   Birth Control: Post-menopausal but still using Depo Provera (see above)  Past Medical History:  Diagnosis Date  . Allergy   . Anemia   . 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 Hyperlipidemia   . Hypertension   . Meningioma [facility]) July 2012   primary osseus Chordoid meningioma of left sphenoid. followed at [facility] by Dr. Redmond Pulling   Past Surgical History:  Procedure Laterality Date  . CYST REMOVAL HAND Left 2011  . TUMOR REMOVAL Left July 18th 2012   left sphenoid primary osseus meningioma    Social:  Social History   Social History  . Marital status: Single    Spouse name: N/A  . Number of children: N/A  . Years of education: N/A   Social History Main Topics  . Smoking status: Light Tobacco Smoker    Types: Cigars  . Smokeless tobacco: None     Comment: Black and mild  once a week  . Alcohol use 0.0 oz/week     Comment: occasionally  . Drug use:     Types: Marijuana, "Crack" cocaine     Comment: quit several years ago  . Sexual activity: Not Asked   Other Topics Concern  . None   Social History Narrative  . None    Immunization: Immunization History  Administered Date(s) Administered  . Influenza,inj,Quad PF,36+ Mos 01/18/2014, 10/26/2014  . PPD Test 02/12/2014, 10/05/2016, 10/28/2016    Cancer Screening:  Pap Smear: 12/14/2012  Mammogram: Last 03/13/2016  Colonoscopy: 11/16/2013  Physical Exam: VITALS: Reviewed GEN: Pleasant female, NAD HEENT: Normocephalic, PERRL, EOMI, no scleral icterus, bilateral TM pearly grey, nasal septum midline, MMM, uvula midline, no anterior or posterior lymphadenopathy, no thyromegaly CARDIAC: RRR, S1 and S2 present, no murmur, no heaves/thrills RESP: CTAB, normal effort ABD: Soft, no tenderness, normal bowel sounds EXT: No edema, 2+ radial and DP pulses SKIN: No rash NEURO: A&Ox3, no focal deficits PSYCH: Appropriate mood and affect  ASSESSMENT & PLAN: 56 y.o. female presents for annual well woman/preventative exam and GYN exam. Please see problem specific assessment and plan.   Essential hypertension, benign BP elevated today at 150/90. Patient has not had BP meds in about two months. Suspect headaches may be worsening 2/2 uncontrolled hypertension.  - Refilled HCTZ today - BMP today to monitor Cr as has not been checked in at least two years  Hyperlipidemia Due for  lipid panel. Patient not fasting, so will get direct LDL only today.   Chronic tension headaches Likely worsening recently due to uncontrolled HTN as well as patient being out of daily meds. No signs of neuro deficits.  - Refilled Elavil - Discussed that better BP control will help with headaches  Adin Hector, MD, MPH PGY-2 [facility] Pager [phone]

## 2016-12-21 NOTE — Assessment & Plan Note (Signed)
BP elevated today at 150/90. Patient has not had BP meds in about two months. Suspect headaches may be worsening 2/2 uncontrolled hypertension.  - Refilled HCTZ today - BMP today to monitor Cr as has not been checked in at least two years

## 2016-12-22 ENCOUNTER — Telehealth: Payer: Self-pay | Admitting: Internal Medicine

## 2016-12-22 LAB — COMPLETE METABOLIC PANEL WITH GFR
ALK PHOS: 88 U/L (ref 33–130)
ALT: 13 U/L (ref 6–29)
AST: 15 U/L (ref 10–35)
Albumin: 4 g/dL (ref 3.6–5.1)
BUN: 18 mg/dL (ref 7–25)
CHLORIDE: 107 mmol/L (ref 98–110)
CO2: 27 mmol/L (ref 20–31)
CREATININE: 0.82 mg/dL (ref 0.50–1.05)
Calcium: 9.4 mg/dL (ref 8.6–10.4)
GFR, Est African American: >89 mL/min (ref >=60)
GFR, Est Non African American: 82 mL/min (ref >=60)
Glucose, Bld: 93 mg/dL (ref 65–99)
Potassium: 3.7 mmol/L (ref 3.5–5.3)
Sodium: 144 mmol/L (ref 135–146)
Total Bilirubin: 0.5 mg/dL (ref 0.2–1.2)
Total Protein: 7.3 g/dL (ref 6.1–8.1)

## 2016-12-22 LAB — LDL CHOLESTEROL, DIRECT: LDL DIRECT: 135 mg/dL — AB (ref <130)

## 2016-12-22 NOTE — Telephone Encounter (Signed)
Mrs Lanius, came in today to pick up physical form. Stated would come back tomorrow . Please f/u with patient when forms ready to pick up [phone]

## 2017-02-03 ENCOUNTER — Other Ambulatory Visit: Payer: Self-pay | Admitting: Internal Medicine

## 2017-02-03 DIAGNOSIS — Z1231 Encounter for screening mammogram for malignant neoplasm of breast: Secondary | ICD-10-CM

## 2017-03-15 ENCOUNTER — Ambulatory Visit: Payer: Medicaid Other

## 2017-03-16 ENCOUNTER — Ambulatory Visit
Admission: RE | Admit: 2017-03-16 | Discharge: 2017-03-16 | Disposition: A | Discharge status: Home / Self Care | Payer: Medicaid Other | Source: Ambulatory Visit | Attending: Family Medicine | Admitting: Family Medicine

## 2017-03-16 DIAGNOSIS — Z1231 Encounter for screening mammogram for malignant neoplasm of breast: Secondary | ICD-10-CM | POA: Diagnosis not present

## 2017-03-17 ENCOUNTER — Other Ambulatory Visit: Payer: Self-pay | Admitting: Family Medicine

## 2017-03-17 DIAGNOSIS — R928 Other abnormal and inconclusive findings on diagnostic imaging of breast: Secondary | ICD-10-CM

## 2017-03-23 ENCOUNTER — Ambulatory Visit
Admission: RE | Admit: 2017-03-23 | Discharge: 2017-03-23 | Disposition: A | Discharge status: Home / Self Care | Payer: Medicaid Other | Source: Ambulatory Visit | Attending: Family Medicine | Admitting: Family Medicine

## 2017-03-23 DIAGNOSIS — R928 Other abnormal and inconclusive findings on diagnostic imaging of breast: Secondary | ICD-10-CM

## 2017-04-06 ENCOUNTER — Ambulatory Visit (INDEPENDENT_AMBULATORY_CARE_PROVIDER_SITE_OTHER): Payer: Medicaid Other | Admitting: Student

## 2017-04-06 ENCOUNTER — Encounter: Payer: Self-pay | Admitting: Student

## 2017-04-06 VITALS — BP 120/80 | HR 101 | Temp 98.3°F | Wt 143.0 lb

## 2017-04-06 DIAGNOSIS — J45909 Unspecified asthma, uncomplicated: Secondary | ICD-10-CM | POA: Diagnosis not present

## 2017-04-06 DIAGNOSIS — J302 Other seasonal allergic rhinitis: Secondary | ICD-10-CM

## 2017-04-06 DIAGNOSIS — R0683 Snoring: Secondary | ICD-10-CM | POA: Diagnosis present

## 2017-04-06 MED ORDER — CETIRIZINE HCL 10 MG PO TABS: 10.0000 mg | ORAL_TABLET | Freq: Every day | ORAL | 11 refills | Status: DC

## 2017-04-06 MED ORDER — FLUTICASONE PROPIONATE 50 MCG/ACT NA SUSP: 2.0000 | Freq: Every day | NASAL | 6 refills | Status: DC

## 2017-04-06 NOTE — Progress Notes (Signed)
   Subjective:    Patient ID: Cynthia Orozco, female    DOB: Jun 23, 1961, 56 y.o.   MRN: [id number]   CC: allergies  HPI: 56 y/o F with history of seasonal allergies  Seasonal allergies - has signifcant congestion with watery eyes each year associated with allergies - this year she has tried flonase, zyrtec and allegra with little relief - she presents to ask for further therapy - she has not had fever, or SOB  Concern for OSA - has a long history of snoring and waking up gasping for breath - her mother has OSA and uses a cpap machine   Smoking status reviewed  Review of Systems  Per HPI, else denies recent illness,  chest pain, shortness of breath   Objective:  BP 120/80   Pulse (!) 101   Temp 98.3 F (36.8 C) (Oral)   Wt 143 lb (64.9 kg)   SpO2 99%   BMI 23.80 kg/m  Vitals and nursing note reviewed  General: NAD Cardiac: RRR,  Respiratory: CTAB, normal effort Skin: warm and dry, no rashes noted Neuro: alert and oriented, no focal deficits   Assessment & Plan:    Seasonal allergies Zyrtec and Flonase refilled. Given these have not been helping and the longstanding nature of her allergies, will refer to an allergist - return precautions discussed  Snoring Given snoring and AM headache with family history of OSA, will obtain sleep study    Alyssa A. Lincoln Brigham MD, [facility] Resident PGY-3 Pager [phone]

## 2017-04-06 NOTE — Patient Instructions (Signed)
Follow up with PCP as needed You will be called for Allergy appointment and Sleep study test to look for sleep apena Call the office with questions concerns

## 2017-04-06 NOTE — Assessment & Plan Note (Signed)
Zyrtec and Flonase refilled. Given these have not been helping and the longstanding nature of her allergies, will refer to an allergist - return precautions discussed

## 2017-04-06 NOTE — Assessment & Plan Note (Signed)
Given snoring and AM headache with family history of OSA, will obtain sleep study

## 2017-05-26 ENCOUNTER — Ambulatory Visit: Payer: Self-pay | Admitting: Allergy

## 2017-05-28 ENCOUNTER — Ambulatory Visit (INDEPENDENT_AMBULATORY_CARE_PROVIDER_SITE_OTHER): Payer: Medicaid Other | Admitting: Allergy

## 2017-05-28 ENCOUNTER — Encounter: Payer: Self-pay | Admitting: Allergy

## 2017-05-28 VITALS — BP 108/62 | HR 96 | Temp 98.2°F | Resp 19 | Ht 64.0 in | Wt 143.2 lb

## 2017-05-28 DIAGNOSIS — J309 Allergic rhinitis, unspecified: Secondary | ICD-10-CM

## 2017-05-28 DIAGNOSIS — T781XXA Other adverse food reactions, not elsewhere classified, initial encounter: Secondary | ICD-10-CM

## 2017-05-28 DIAGNOSIS — H1010 Acute atopic conjunctivitis, unspecified eye: Secondary | ICD-10-CM | POA: Diagnosis not present

## 2017-05-28 DIAGNOSIS — J4530 Mild persistent asthma, uncomplicated: Secondary | ICD-10-CM

## 2017-05-28 MED ORDER — OLOPATADINE HCL 0.2 % OP SOLN: 1.0000 [drp] | Freq: Every day | OPHTHALMIC | 5 refills | Status: DC

## 2017-05-28 MED ORDER — CETIRIZINE HCL 10 MG PO TABS: 10.0000 mg | ORAL_TABLET | Freq: Every day | ORAL | 5 refills | Status: DC

## 2017-05-28 MED ORDER — FLUTICASONE PROPIONATE 50 MCG/ACT NA SUSP: 2.0000 | Freq: Every day | NASAL | 5 refills | Status: DC

## 2017-05-28 MED ORDER — MONTELUKAST SODIUM 10 MG PO TABS: 10.0000 mg | ORAL_TABLET | Freq: Every day | ORAL | 5 refills | Status: DC

## 2017-05-28 NOTE — Patient Instructions (Addendum)
Allergic rhinoconjunctivitis    - allergy testing today is positive for grasses, weeds, trees, cat, dog.   Allergen avoidance measures discussed today and handouts provided.      - continue Zyrtec 10mg      - recommend stating singulair 10mg  in late January/early February along with other allergy medications in preparation for spring.      - continue flonase 2 sprays each nostril daily    - continue Pataday 1 drop each eye daily as needed for itchy/watery/red eyes    - recommend use of nasal saline rinse daily during spring  Allergy-induced asthma   - you have symptoms consistent with allergy-induced asthma with cough/wheezing during spring relieved with albuterol use.    - continue to have access to albuterol inhaler use    - use singulair as above  Pollen food allergy syndrome    - food allergy testing for fruits were negative thus you do not have food allergy to this fruits however they can be cross-reactive with certain pollens that you are sensitive to.  This can lead to oral symptoms like itching/swelling.  Symptoms rarely progress to life-threatening symptoms.  It is recommended to avoid the fruits to prevent symptoms from occurring.   Follow-up 6 months or sooner if needed

## 2017-05-28 NOTE — Progress Notes (Signed)
New Patient Note  RE: Cynthia Orozco MRN: [id number] DOB: 02-07-1961 Date of Office Visit: 05/28/2017  Referring provider: Veatrice Bourbon, MD Primary care provider: Verner Mould, MD  Chief Complaint: environmental allergies  History of present illness: Cynthia Orozco is a 56 y.o. female presenting today for consultation for environmental allergies.    She reports every spring she has symptoms that are getting worse each year.  Symptoms include sneezing, runny nose, itchy/watery eyes and cough.   This year she reports coughing was so bad that she was having difficulty breathing.  Symptoms are also worse late at night.   She has tried benadryl, claritin, sudafed all did not help relieve her symptoms.  She reports Zyrtec was helpful; she was advised to take it at night and felt that it would wear off during the day.  She has Pataday that does help with the itching temporarily.  She also has generic flonase that she reports works temporarily.  She uses a humidifier.  She also reports that strong odors can cause her to have increase nasal congestion and sneezing. She reports she is asymptomatic during summer-winter.   She did have allergy testing done when she was a teenager and she recalls it being very itchy.  She did allergen immunotherapy as a teenager and states 'they didn't work' so she stopped but doesn't remember how long she was on immunotherapy.   She had a brain tumor removed several years ago and reports it affected her nerves on the left side face and reports that her left nostril is very "clogged" due to this.    She denies a history of asthma but she does have an albuterol inhaler that she reports using for cough and wheeze during the spring.  She denies any hospitalizations related to her breathing and denies any oral steroid use for respiratory issues.    She does report with apple, strawberry, pinapple and grape she develops mouth and throat itchiness but she  continues to eat these fruits.  She has not had any GI, respiratory or CV related symptoms with food ingestion.     Review of systems: Review of Systems  Constitutional: Negative for chills, fever and malaise/fatigue.  HENT: Negative for congestion, ear discharge, ear pain, nosebleeds, sinus pain, sore throat and tinnitus.   Eyes: Negative for discharge and redness.  Respiratory: Negative for cough, shortness of breath and wheezing.   Cardiovascular: Negative for chest pain.  Gastrointestinal: Negative for abdominal pain, diarrhea, nausea and vomiting.  Musculoskeletal: Negative for joint pain and myalgias.  Skin: Negative for itching and rash.  Neurological: Negative for headaches.    All other systems negative unless noted above in HPI  Past medical history: Past Medical History:  Diagnosis Date  . Allergy   . Anemia   . 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 Hyperlipidemia   . Hypertension   . Meningioma [facility]) July 2012   primary osseus Chordoid meningioma of left sphenoid. followed at [facility] by Dr. Redmond Pulling    Past surgical history: Past Surgical History:  Procedure Laterality Date  . CYST REMOVAL HAND Left 2011  . TUMOR REMOVAL Left July 18th 2012   left sphenoid primary osseus meningioma    Family history:  Family History  Problem Relation Age of Onset  . Diabetes Mother   . Dementia Maternal Grandmother   . Diabetes Father     Social history: She lives in an  apartment with carpeting with electric heating and central cooling. There are no pets in the home. There is no concern for water damage, mildew or roaches in the home. She is unemployed. She has history of marijuana and cocaine use but reports quit several years ago.  She denies current tobacco use.     Medication List: Allergies as of 05/28/2017      Reactions   Apple Anaphylaxis   Only red apples so far, can tolerate green apples   Grape Extract (vitis Vinifera) [vitis  Viniferae] Anaphylaxis   Actual fruit , grapes   Pineapple Anaphylaxis   Strawberry Extract Anaphylaxis   Other Other (See Comments)   FRUITS- : Kiwi, apples, bananas, strawberries - Urticaria  Vegetables: potatoes - rash FRUITS- : Kiwi, apples, bananas, strawberries - Urticaria  Vegetables: potatoes - rash   Latex Other (See Comments), Rash   breaks out breaks out breaks out      Medication List       Accurate as of 05/28/17  3:50 PM. Always use your most recent med list.          albuterol 108 (90 Base) MCG/ACT inhaler Commonly known as:  PROVENTIL HFA;VENTOLIN HFA Inhale 2 puffs into the lungs every 6 (six) hours as needed for wheezing or shortness of breath.   CALCIUM 600 600 MG Tabs tablet Generic drug:  calcium carbonate Take 600 mg by mouth.   cetirizine 10 MG tablet Commonly known as:  ZYRTEC Take 1 tablet (10 mg total) by mouth daily.   fish oil-omega-3 fatty acids 1000 MG capsule Take 1 g by mouth every morning.   fluticasone 50 MCG/ACT nasal spray Commonly known as:  FLONASE Place 2 sprays into both nostrils daily.   hydrochlorothiazide 25 MG tablet Commonly known as:  HYDRODIURIL Take 1 tablet (25 mg total) by mouth daily.   montelukast 10 MG tablet Commonly known as:  SINGULAIR Take 1 tablet (10 mg total) by mouth at bedtime.   multivitamin with minerals Tabs tablet Take 1 tablet by mouth daily.   Olopatadine HCl 0.2 % Soln Commonly known as:  PATADAY Apply 1 drop to eye daily.   valACYclovir 500 MG tablet Commonly known as:  VALTREX       Known medication allergies: Allergies  Allergen Reactions  . Apple Anaphylaxis    Only red apples so far, can tolerate green apples  . Grape Extract (Vitis Vinifera) [Vitis Viniferae] Anaphylaxis    Actual fruit , grapes  . Pineapple Anaphylaxis  . Strawberry Extract Anaphylaxis  . Other Other (See Comments)    FRUITS- : Kiwi, apples, bananas, strawberries - Urticaria  Vegetables: potatoes -  rash FRUITS- : Kiwi, apples, bananas, strawberries - Urticaria  Vegetables: potatoes - rash  . Latex Other (See Comments) and Rash    breaks out breaks out breaks out     Physical examination: Blood pressure 108/62, pulse 96, temperature 98.2 F (36.8 C), resp. rate 19, height 5\' 4"  (1.626 m), weight 143 lb 3.2 oz (65 kg), SpO2 94 %.  General: Alert, interactive, in no acute distress. HEENT: TMs pearly gray, turbinates minimally edematous without discharge, post-pharynx non erythematous. Neck: Supple without lymphadenopathy. Lungs: Clear to auscultation without wheezing, rhonchi or rales. {no increased work of breathing. CV: Normal S1, S2 without murmurs. Abdomen: Nondistended, nontender. Skin: Warm and dry, without lesions or rashes.  Multiple tatoos on face and arms  Extremities:  No clubbing, cyanosis or edema. Neuro:   Grossly intact.  Diagnositics/Labs:  Allergy  testing: environmental allergy skin prick testing was positive for grasses, trees, weeds, cat Intradermal testing was positive to dog Allergy testing results were read and interpreted by provider, documented by clinical staff.   Assessment and plan:   Allergic rhinoconjunctivitis    - allergy testing today is positive for grasses, weeds, trees, cat, dog.   Allergen avoidance measures discussed today and handouts provided.      - continue Zyrtec 10mg  (may take twice a day during spring season)    - recommend starting singulair 10mg  in late January/early February along with other allergy medications in preparation for spring.      - continue flonase 2 sprays each nostril daily for nasal congestion/drainage    - continue Pataday 1 drop each eye daily as needed for itchy/watery/red eyes    - recommend use of nasal saline rinse daily during spring    - discussed allergen immunotherapy today and she is not interested in this therapeutic option at this time.    Allergy-induced asthma   - you have symptoms consistent  with allergy-induced asthma with cough/wheezing during spring relieved with albuterol use.    - continue to have access to albuterol inhaler use    - use singulair as above  Pollen food allergy syndrome    - food allergy testing for fruits were negative thus you do not have food allergy to this fruits however they can be cross-reactive with certain pollens that you are sensitive to.  This can lead to oral symptoms like itching/swelling.  Symptoms rarely progress to life-threatening symptoms.  It is recommended to avoid the fruits to prevent symptoms from occurring.   Follow-up 6 months or sooner if needed   I appreciate the opportunity to take part in Mychaela's care. Please do not hesitate to contact me with questions.  Sincerely,   Prudy Feeler, MD Allergy/Immunology [facility]

## 2017-06-01 ENCOUNTER — Encounter (HOSPITAL_BASED_OUTPATIENT_CLINIC_OR_DEPARTMENT_OTHER): Payer: Medicaid Other

## 2017-09-13 ENCOUNTER — Telehealth: Payer: Self-pay | Admitting: *Deleted

## 2017-09-13 DIAGNOSIS — I10 Essential (primary) hypertension: Secondary | ICD-10-CM

## 2017-09-15 ENCOUNTER — Other Ambulatory Visit: Payer: Self-pay | Admitting: Internal Medicine

## 2017-09-15 DIAGNOSIS — I10 Essential (primary) hypertension: Secondary | ICD-10-CM

## 2017-09-15 NOTE — Telephone Encounter (Signed)
Patient came to office stated she is totally out of her RX Hydrodiuril and also needs refill on Valtrex.  [facility] on [address]. If any questions patient may be reached at [phone]

## 2017-09-16 ENCOUNTER — Telehealth: Payer: Self-pay | Admitting: Internal Medicine

## 2017-09-16 DIAGNOSIS — I10 Essential (primary) hypertension: Secondary | ICD-10-CM

## 2017-09-16 MED ORDER — HYDROCHLOROTHIAZIDE 25 MG PO TABS: 25.0000 mg | ORAL_TABLET | Freq: Every day | ORAL | 3 refills | Status: DC

## 2017-09-16 NOTE — Telephone Encounter (Signed)
Patient is out of medication.  Martin, Tamika L, RN  

## 2017-09-16 NOTE — Telephone Encounter (Signed)
[facility] on [address] is charging patient more than her normal $3 now for her Hydrochlorothiazide and Valtrex now so she wants to know what we can do?  She said pharmacy told her maybe we can get PCP to authorize under the medicaid (?)  [phone] she can be reached w/questions.

## 2017-09-21 MED ORDER — HYDROCHLOROTHIAZIDE 25 MG PO TABS: 25.0000 mg | ORAL_TABLET | Freq: Every day | ORAL | 11 refills | Status: DC

## 2017-09-21 MED ORDER — VALACYCLOVIR HCL 500 MG PO TABS: 500.0000 mg | ORAL_TABLET | Freq: Two times a day (BID) | ORAL | 11 refills | Status: DC

## 2017-09-21 NOTE — Telephone Encounter (Signed)
Called patient to inform her that both medications are on $4 list at [place]. Patient did not answer and I am unsure which [facility] she would like to have these sent to, so informed her in voicemail that prescriptions will be printed and left at front desk for her to pick up and take to whichever [place] she prefers.   Adin Hector, MD, MPH PGY-3 [facility] Pager [phone]

## 2018-01-26 ENCOUNTER — Ambulatory Visit (INDEPENDENT_AMBULATORY_CARE_PROVIDER_SITE_OTHER): Payer: Medicaid Other | Admitting: Internal Medicine

## 2018-01-26 ENCOUNTER — Encounter: Payer: Self-pay | Admitting: Internal Medicine

## 2018-01-26 VITALS — BP 105/80 | HR 74 | Temp 98.3°F | Ht 64.0 in | Wt 134.2 lb

## 2018-01-26 DIAGNOSIS — I10 Essential (primary) hypertension: Secondary | ICD-10-CM | POA: Diagnosis not present

## 2018-01-26 DIAGNOSIS — Z0000 Encounter for general adult medical examination without abnormal findings: Secondary | ICD-10-CM

## 2018-01-26 DIAGNOSIS — E785 Hyperlipidemia, unspecified: Secondary | ICD-10-CM | POA: Diagnosis not present

## 2018-01-26 DIAGNOSIS — R0981 Nasal congestion: Secondary | ICD-10-CM

## 2018-01-26 NOTE — Patient Instructions (Addendum)
It was nice seeing you again today Ms. Deyarmin!  Please STOP taking your blood pressure medication (HCTZ). If you start to have frequent headaches, or note that your blood pressure is consistently high when you measure it at the store, please call to let me know.   I have placed a referral to ENT for you. They will call you with the date and time of this appointment.   I will call you if there are any abnormalities with the bloodwork we collected today.   If you have any questions or concerns, please feel free to call the clinic.   Be well,  Dr. Avon Gully

## 2018-01-26 NOTE — Assessment & Plan Note (Signed)
Seems most likely related to prior facial surgery, as symptoms started after surgery and have not improved with numerous OTC and prescription therapies. Also involving only L nostril, which makes other etiology less likely. No gross abnormalities noted on exam today. Will refer to ENT for more thorough evaluation and possible imaging.  - Referral to ENT placed

## 2018-01-26 NOTE — Assessment & Plan Note (Signed)
Lipid panel today

## 2018-01-26 NOTE — Progress Notes (Signed)
57 y.o. year old female presents for well woman/preventative visit and annual GYN examination.  Acute Concerns: #HTN Prescribed HCTZ. Hasn't taken consistently in many months. Occasionally takes if she develops a headache. Checks BP at drugstore. Typically low 100s. One time was very high after she got in an argument with a family member. Has not taken meds today and is 105/80 in office. Endorses occasional headaches, though this is not a new issue. Says they go away immediately after taking HCTZ. Denies vision changes.   #Breathing concerns Says that she cannot breathe out of her L nostril. Started in 2012 after operation to remove a tumor from behind her L eye. Is becoming more bothersome now. Says she has tried every over the counter product with no relief. Neither Flonase nor Afrin have been effective at all. Has also tried nasal saline and Vicks VapoRub with no benefit. Has difficulty sleeping at night due to nasal blockage, as she feels like she cannot breathe at times.   Diet: Thinks well-balanced.   Exercise: Walks  Sexual/Birth History: G3P3. Not currently sexually active (recently broke up with her boyfriend)  Birth Control: Was taking Depo last year despite being postmenopausal, however discontinued this.   Surgical History: Past Surgical History:  Procedure Laterality Date  . CYST REMOVAL HAND Left 2011  . TUMOR REMOVAL Left July 18th 2012   left sphenoid primary osseus meningioma    Allergies: Allergies  Allergen Reactions  . Apple Anaphylaxis    Only red apples so far, can tolerate green apples  . Grape Extract (Vitis Vinifera) [Vitis Viniferae] Anaphylaxis    Actual fruit , grapes  . Pineapple Anaphylaxis  . Strawberry Extract Anaphylaxis  . Other Other (See Comments)    FRUITS- : Kiwi, apples, bananas, strawberries - Urticaria  Vegetables: potatoes - rash FRUITS- : Kiwi, apples, bananas, strawberries - Urticaria  Vegetables: potatoes - rash  . Latex Other  (See Comments) and Rash    breaks out breaks out breaks out    Social:  Social History   Socioeconomic History  . Marital status: Single    Spouse name: None  . Number of children: None  . Years of education: None  . Highest education level: None  Social Needs  . Financial resource strain: None  . Food insecurity - worry: None  . Food insecurity - inability: None  . Transportation needs - medical: None  . Transportation needs - non-medical: None  Occupational History  . None  Tobacco Use  . Smoking status: Light Tobacco Smoker    Types: Cigars  . Smokeless tobacco: Never Used  . Tobacco comment: Black and mild once a week  Substance and Sexual Activity  . Alcohol use: Yes    Alcohol/week: 0.0 oz    Comment: occasionally  . Drug use: Yes    Types: Marijuana    Comment: quit several years ago  . Sexual activity: Yes    Birth control/protection: None  Other Topics Concern  . None  Social History Narrative  . None    Immunization: Immunization History  Administered Date(s) Administered  . Influenza,inj,Quad PF,6+ Mos 01/18/2014, 10/26/2014  . PPD Test 02/12/2014, 10/05/2016, 10/28/2016    Cancer Screening:  Pap Smear: Last year at gynecologist  Mammogram: Performed 03/16/2017  Colonoscopy: Performed 11/16/2013  Physical Exam: VITALS: Reviewed GEN: Pleasant female, NAD HEENT: Normocephalic, PERRL, EOMI, no scleral icterus, bilateral TM pearly grey, nasal septum midline, MMM, uvula midline, no anterior or posterior lymphadenopathy, no thyromegaly. No obvious nasal deformities  noted including polyps.  CARDIAC:RRR, S1 and S2 present, no murmur, no heaves/thrills RESP: CTAB, normal effort ABD: Soft, no tenderness, normal bowel sounds EXT: No edema, 2+ radial and DP pulses SKIN: Warm and dry, no rash NEURO: A&Ox3 PSYCH: Appropriate mood and affect  ASSESSMENT & PLAN: 57 y.o. female presents for annual well woman/preventative exam and GYN exam. Please see  problem specific assessment and plan.   Essential hypertension, benign Well-controlled. Has not taken HCTZ consistently in many months. BP today 105/80 without taking meds. Also reports consistently low BP when checking at drugstore, with exception of one measurement after she got into an argument. Do not feel that headaches are related to hypertension, as patient with history of headaches, and would not expect a patient with such well-controlled blood pressure off of medication to have BP high enough to cause headache. As such, will discontinue HCTZ (patient not taking regularly anyway). Can take ibuprofen or Tylenol for HA. Encouraged to call if BP persistently high when checking at drugstore.   Hyperlipidemia Lipid panel today.   Chronic L nare congestion Seems most likely related to prior facial surgery, as symptoms started after surgery and have not improved with numerous OTC and prescription therapies. Also involving only L nostril, which makes other etiology less likely. No gross abnormalities noted on exam today. Will refer to ENT for more thorough evaluation and possible imaging.  - Referral to ENT placed   Adin Hector, MD, MPH PGY-3 [facility] Pager [phone]

## 2018-01-26 NOTE — Assessment & Plan Note (Signed)
Well-controlled. Has not taken HCTZ consistently in many months. BP today 105/80 without taking meds. Also reports consistently low BP when checking at drugstore, with exception of one measurement after she got into an argument. Do not feel that headaches are related to hypertension, as patient with history of headaches, and would not expect a patient with such well-controlled blood pressure off of medication to have BP high enough to cause headache. As such, will discontinue HCTZ (patient not taking regularly anyway). Can take ibuprofen or Tylenol for HA. Encouraged to call if BP persistently high when checking at drugstore.

## 2018-01-27 LAB — BASIC METABOLIC PANEL
BUN/Creatinine Ratio: 16 (ref 9–23)
BUN: 13 mg/dL (ref 6–24)
CHLORIDE: 100 mmol/L (ref 96–106)
CO2: 26 mmol/L (ref 20–29)
CREATININE: 0.8 mg/dL (ref 0.57–1.00)
Calcium: 10.5 mg/dL — ABNORMAL HIGH (ref 8.7–10.2)
GFR, EST AFRICAN AMERICAN: 97 mL/min/{1.73_m2} (ref >59)
GFR, EST NON AFRICAN AMERICAN: 84 mL/min/{1.73_m2} (ref >59)
Glucose: 71 mg/dL (ref 65–99)
Potassium: 3.5 mmol/L (ref 3.5–5.2)
Sodium: 142 mmol/L (ref 134–144)

## 2018-01-27 LAB — CBC
HEMATOCRIT: 43.5 % (ref 34.0–46.6)
Hemoglobin: 14.6 g/dL (ref 11.1–15.9)
MCH: 29.4 pg (ref 26.6–33.0)
MCHC: 33.6 g/dL (ref 31.5–35.7)
MCV: 88 fL (ref 79–97)
Platelets: 386 10*3/uL — ABNORMAL HIGH (ref 150–379)
RBC: 4.96 x10E6/uL (ref 3.77–5.28)
RDW: 13.7 % (ref 12.3–15.4)
WBC: 4.6 10*3/uL (ref 3.4–10.8)

## 2018-01-27 LAB — LIPID PANEL
CHOL/HDL RATIO: 3.6 ratio (ref 0.0–4.4)
Cholesterol, Total: 255 mg/dL — ABNORMAL HIGH (ref 100–199)
HDL: 71 mg/dL (ref >39)
LDL Calculated: 160 mg/dL — ABNORMAL HIGH (ref 0–99)
Triglycerides: 119 mg/dL (ref 0–149)
VLDL Cholesterol Cal: 24 mg/dL (ref 5–40)

## 2018-02-14 ENCOUNTER — Other Ambulatory Visit: Payer: Self-pay | Admitting: Family Medicine

## 2018-02-14 ENCOUNTER — Other Ambulatory Visit: Payer: Self-pay | Admitting: Internal Medicine

## 2018-02-14 DIAGNOSIS — Z1231 Encounter for screening mammogram for malignant neoplasm of breast: Secondary | ICD-10-CM

## 2018-03-07 ENCOUNTER — Ambulatory Visit: Payer: Medicaid Other | Admitting: Internal Medicine

## 2018-03-17 ENCOUNTER — Ambulatory Visit
Admission: RE | Admit: 2018-03-17 | Discharge: 2018-03-17 | Disposition: A | Discharge status: Home / Self Care | Payer: Medicaid Other | Source: Ambulatory Visit | Attending: Family Medicine | Admitting: Family Medicine

## 2018-03-17 DIAGNOSIS — Z1231 Encounter for screening mammogram for malignant neoplasm of breast: Secondary | ICD-10-CM

## 2018-04-12 DIAGNOSIS — Z86010 Personal history of colonic polyps: Secondary | ICD-10-CM | POA: Diagnosis not present

## 2018-04-12 DIAGNOSIS — K5900 Constipation, unspecified: Secondary | ICD-10-CM | POA: Diagnosis not present

## 2018-07-25 DIAGNOSIS — Z0389 Encounter for observation for other suspected diseases and conditions ruled out: Secondary | ICD-10-CM | POA: Diagnosis not present

## 2018-07-25 DIAGNOSIS — Z3009 Encounter for other general counseling and advice on contraception: Secondary | ICD-10-CM | POA: Diagnosis not present

## 2018-07-25 DIAGNOSIS — Z113 Encounter for screening for infections with a predominantly sexual mode of transmission: Secondary | ICD-10-CM | POA: Diagnosis not present

## 2018-07-25 DIAGNOSIS — Z1388 Encounter for screening for disorder due to exposure to contaminants: Secondary | ICD-10-CM | POA: Diagnosis not present

## 2018-08-02 ENCOUNTER — Other Ambulatory Visit: Payer: Self-pay

## 2018-08-02 ENCOUNTER — Encounter: Payer: Self-pay | Admitting: Family Medicine

## 2018-08-02 ENCOUNTER — Ambulatory Visit: Payer: Medicaid Other | Admitting: Family Medicine

## 2018-08-02 ENCOUNTER — Ambulatory Visit: Payer: Medicaid Other

## 2018-08-02 VITALS — BP 100/60 | HR 64 | Temp 98.6°F | Ht 64.0 in | Wt 134.0 lb

## 2018-08-02 DIAGNOSIS — W57XXXA Bitten or stung by nonvenomous insect and other nonvenomous arthropods, initial encounter: Secondary | ICD-10-CM

## 2018-08-02 DIAGNOSIS — Z23 Encounter for immunization: Secondary | ICD-10-CM | POA: Diagnosis not present

## 2018-08-02 DIAGNOSIS — R0981 Nasal congestion: Secondary | ICD-10-CM | POA: Diagnosis not present

## 2018-08-02 MED ORDER — ZOSTER VACCINE LIVE 19400 UNT/0.65ML ~~LOC~~ SUSR: 0.6500 mL | Freq: Once | SUBCUTANEOUS | 0 refills | Status: AC

## 2018-08-02 MED ORDER — TRIAMCINOLONE ACETONIDE 55 MCG/ACT NA AERO: 2.0000 | INHALATION_SPRAY | Freq: Every day | NASAL | 12 refills | Status: DC

## 2018-08-02 MED ORDER — VALACYCLOVIR HCL 500 MG PO TABS: 500.0000 mg | ORAL_TABLET | Freq: Two times a day (BID) | ORAL | 11 refills | Status: DC

## 2018-08-02 NOTE — Patient Instructions (Signed)
It was great to meet you today! Thank you for letting me participate in your care!  Today, we discussed rash on your back that looks likely to be due to a bug bite of some kind. Continue using Zyrtec and hydrocortisone cream as needed to control the itching. If it gets worse, starts to burn, and/or starts to spread return to the clinic immediately.  Please schedule an appointment with your PCP to talk more about anxiety and what steps we can help you take to gain better control.   Be well, Harolyn Rutherford, DO PGY-2, [facility]

## 2018-08-02 NOTE — Assessment & Plan Note (Signed)
Continue anti-histamine Zyrtec and hydrocortisone cream to the affected area.

## 2018-08-02 NOTE — Assessment & Plan Note (Signed)
Referral to ENT. She had stopped taking Flonase b/c she stated it did not work so I started her on Nasacort. Continue montelukast and zyrtec.

## 2018-08-02 NOTE — Progress Notes (Signed)
Subjective: Chief Complaint  Patient presents with  . ? shingles    bumps on back     HPI: Cynthia Orozco is a 57 y.o. presenting to clinic today to discuss the following:  Concern for Shingles Outbreak Patient states she began having itching and slight pain with mild burning yesterday after laying down on her couch at home on her back. She had shingles in the past and was concerned that it may be a recurrence. She never did receive the Shingles vaccine after her outbreak in 2016.  Chronic Left Sinus Congestion Wants referral to ENT for chronic congestion while taking several allergy medications. This has been present since surgery to remove Meningeal tumor in 2012.  Health Maintenance: None     ROS noted in HPI.   Past Medical, Surgical, Social, and Family History Reviewed & Updated per EMR.   Pertinent Historical Findings include:   Social History   Tobacco Use  Smoking Status Light Tobacco Smoker  . Types: Cigars  Smokeless Tobacco Never Used  Tobacco Comment   Black and mild once a week      Objective: Ht 5\' 4"  (1.626 m)   Wt 134 lb (60.8 kg)   BMI 23.00 kg/m  Vitals and nursing notes reviewed  Physical Exam Gen: Alert and Oriented x 3, NAD HEENT: Normocephalic, atraumatic CV: RRR, no murmurs, normal S1, S2 split Resp: CTAB, no wheezing, rales, or rhonchi, comfortable work of breathing MSK: Moves all four extremities Ext: no clubbing, cyanosis, or edema Skin: warm, dry, intact, Two small areas of localized non-pustule raised erythematous lesions located at the left inferior portion of the scapula.   No results found for this or any previous visit (from the past 72 hour(s)).  Assessment/Plan:  Chronic L nare congestion Referral to ENT. She had stopped taking Flonase b/c she stated it did not work so I started her on Nasacort. Continue montelukast and zyrtec.  Bug bite Continue anti-histamine Zyrtec and hydrocortisone cream to the affected  area.   PATIENT EDUCATION PROVIDED: See AVS    Diagnosis and plan along with any newly prescribed medication(s) were discussed in detail with this patient today. The patient verbalized understanding and agreed with the plan. Patient advised if symptoms worsen return to clinic or ER.   Health Maintainance:   Orders Placed This Encounter  Procedures  . Ambulatory referral to ENT    Referral Priority:   Routine    Referral Type:   Consultation    Referral Reason:   Specialty Services Required    Requested Specialty:   Otolaryngology    Number of Visits Requested:   1    Meds ordered this encounter  Medications  . triamcinolone (NASACORT) 55 MCG/ACT AERO nasal inhaler    Sig: Place 2 sprays into the nose daily.    Dispense:  1 Inhaler    Refill:  12  . Zoster Vaccine Live, PF, (ZOSTAVAX) 74259 UNT/0.65ML injection    Sig: Inject 19,400 Units into the skin once for 1 dose.    Dispense:  1 each    Refill:  0  . DISCONTD: valACYclovir (VALTREX) 500 MG tablet    Sig: Take 1 tablet (500 mg total) by mouth 2 (two) times daily. Take as needed    Dispense:  60 tablet    Refill:  11  . valACYclovir (VALTREX) 500 MG tablet    Sig: Take 1 tablet (500 mg total) by mouth 2 (two) times daily. Take as needed  Dispense:  60 tablet    Refill:  [place], DO 08/02/2018, 11:36 AM PGY-2 [place]

## 2018-08-05 ENCOUNTER — Ambulatory Visit: Payer: Medicaid Other

## 2018-08-05 ENCOUNTER — Ambulatory Visit (INDEPENDENT_AMBULATORY_CARE_PROVIDER_SITE_OTHER): Payer: Medicaid Other

## 2018-08-05 DIAGNOSIS — Z111 Encounter for screening for respiratory tuberculosis: Secondary | ICD-10-CM | POA: Diagnosis not present

## 2018-08-05 NOTE — Progress Notes (Signed)
   Tuberculin skin test applied to right ventral forearm. Appointment made for 08/08/28 for PPD reading.  Danley Danker, RN [facility] RN)

## 2018-08-08 ENCOUNTER — Ambulatory Visit: Payer: Medicaid Other

## 2018-08-08 DIAGNOSIS — Z111 Encounter for screening for respiratory tuberculosis: Secondary | ICD-10-CM

## 2018-08-08 LAB — TB SKIN TEST: TB Skin Test: NEGATIVE

## 2018-08-08 NOTE — Progress Notes (Signed)
PPD Reading Note PPD read and results entered in [place]. Result: 0 mm induration. Interpretation: negative   Wallace Cullens, RN

## 2018-10-19 DIAGNOSIS — Z3009 Encounter for other general counseling and advice on contraception: Secondary | ICD-10-CM | POA: Diagnosis not present

## 2018-10-19 DIAGNOSIS — Z113 Encounter for screening for infections with a predominantly sexual mode of transmission: Secondary | ICD-10-CM | POA: Diagnosis not present

## 2018-10-19 DIAGNOSIS — Z0389 Encounter for observation for other suspected diseases and conditions ruled out: Secondary | ICD-10-CM | POA: Diagnosis not present

## 2018-10-19 DIAGNOSIS — Z1388 Encounter for screening for disorder due to exposure to contaminants: Secondary | ICD-10-CM | POA: Diagnosis not present

## 2018-12-23 DIAGNOSIS — Z23 Encounter for immunization: Secondary | ICD-10-CM | POA: Diagnosis not present

## 2018-12-30 ENCOUNTER — Emergency Department (HOSPITAL_COMMUNITY)
Admission: EM | Admit: 2018-12-30 | Discharge: 2018-12-30 | Disposition: A | Discharge status: Home / Self Care | Payer: Medicaid Other | Attending: Emergency Medicine | Admitting: Emergency Medicine

## 2018-12-30 ENCOUNTER — Other Ambulatory Visit: Payer: Self-pay

## 2018-12-30 ENCOUNTER — Encounter (HOSPITAL_COMMUNITY): Payer: Self-pay

## 2018-12-30 DIAGNOSIS — Z91040 Latex allergy status: Secondary | ICD-10-CM | POA: Insufficient documentation

## 2018-12-30 DIAGNOSIS — I10 Essential (primary) hypertension: Secondary | ICD-10-CM | POA: Insufficient documentation

## 2018-12-30 DIAGNOSIS — Y999 Unspecified external cause status: Secondary | ICD-10-CM | POA: Diagnosis not present

## 2018-12-30 DIAGNOSIS — F17290 Nicotine dependence, other tobacco product, uncomplicated: Secondary | ICD-10-CM | POA: Insufficient documentation

## 2018-12-30 DIAGNOSIS — Z79899 Other long term (current) drug therapy: Secondary | ICD-10-CM | POA: Diagnosis not present

## 2018-12-30 DIAGNOSIS — Y9389 Activity, other specified: Secondary | ICD-10-CM | POA: Diagnosis not present

## 2018-12-30 DIAGNOSIS — R51 Headache: Secondary | ICD-10-CM | POA: Diagnosis not present

## 2018-12-30 DIAGNOSIS — D329 Benign neoplasm of meninges, unspecified: Secondary | ICD-10-CM | POA: Insufficient documentation

## 2018-12-30 DIAGNOSIS — S199XXA Unspecified injury of neck, initial encounter: Secondary | ICD-10-CM | POA: Diagnosis present

## 2018-12-30 DIAGNOSIS — S161XXA Strain of muscle, fascia and tendon at neck level, initial encounter: Secondary | ICD-10-CM | POA: Insufficient documentation

## 2018-12-30 DIAGNOSIS — Y92481 Parking lot as the place of occurrence of the external cause: Secondary | ICD-10-CM | POA: Insufficient documentation

## 2018-12-30 MED ORDER — ACETAMINOPHEN 325 MG PO TABS
650.0000 mg | ORAL_TABLET | Freq: Once | ORAL | Status: AC
  Administered 2018-12-30: 650 mg via ORAL
  Filled 2018-12-30: qty 2

## 2018-12-30 MED ORDER — METHOCARBAMOL 500 MG PO TABS: 500.0000 mg | ORAL_TABLET | Freq: Two times a day (BID) | ORAL | 0 refills | Status: DC
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
## 2018-12-30 NOTE — Discharge Instructions (Addendum)
Please read attached information. If you experience any new or worsening signs or symptoms please return to the emergency room for evaluation. Please follow-up with your primary care provider or specialist as discussed. Please use medication prescribed only as directed and discontinue taking if you have any concerning signs or symptoms.   °
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
## 2019-01-02 ENCOUNTER — Ambulatory Visit (INDEPENDENT_AMBULATORY_CARE_PROVIDER_SITE_OTHER): Payer: Medicaid Other | Admitting: Family Medicine

## 2019-01-02 ENCOUNTER — Other Ambulatory Visit: Payer: Self-pay

## 2019-01-02 VITALS — BP 152/90 | HR 68 | Temp 98.6°F | Ht 65.0 in | Wt 136.4 lb

## 2019-01-02 DIAGNOSIS — S46811D Strain of other muscles, fascia and tendons at shoulder and upper arm level, right arm, subsequent encounter: Secondary | ICD-10-CM

## 2019-01-02 MED ORDER — BACLOFEN 5 MG PO TABS: 5.0000 mg | ORAL_TABLET | Freq: Three times a day (TID) | ORAL | 0 refills | Status: DC | PRN

## 2019-01-02 NOTE — Patient Instructions (Signed)
It was a pleasure to see you today! Thank you for choosing [facility] for your primary care. Cynthia Orozco was seen for muscle strain.   Our plans for today were:  STOP the robaxin.   Start the baclofen instead.   Continue to use tylenol for pain.   Try a warm compress with rice inside a sock. Also try massage with a tennis ball or a racquetball.    Best,  Dr. Lindell Noe
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
## 2019-02-22 ENCOUNTER — Telehealth: Payer: Self-pay | Admitting: Family Medicine

## 2019-02-22 ENCOUNTER — Other Ambulatory Visit: Payer: Self-pay | Admitting: Family Medicine

## 2019-02-22 DIAGNOSIS — Z1231 Encounter for screening mammogram for malignant neoplasm of breast: Secondary | ICD-10-CM

## 2019-02-22 NOTE — Telephone Encounter (Signed)
Shredded prescription for Hydrochlorothiazide dated 09/21/17 that was never picked up.

## 2019-02-23 ENCOUNTER — Other Ambulatory Visit: Payer: Self-pay | Admitting: Family Medicine

## 2019-02-23 DIAGNOSIS — H1010 Acute atopic conjunctivitis, unspecified eye: Secondary | ICD-10-CM

## 2019-02-23 DIAGNOSIS — J309 Allergic rhinitis, unspecified: Principal | ICD-10-CM

## 2019-02-23 MED ORDER — CETIRIZINE HCL 10 MG PO TABS: 10.0000 mg | ORAL_TABLET | Freq: Every day | ORAL | 0 refills | Status: DC
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
## 2019-03-01 ENCOUNTER — Telehealth: Payer: Self-pay | Admitting: Family Medicine

## 2019-03-01 NOTE — Telephone Encounter (Signed)
Pt called wanting to make an appt to get test for COVID-19. Asked pt what her symptoms are, cough, fever, SOB, travel, exposure? Pt says she has not had any symptoms and no travel, but she has been around someone who is now really sick. I asked her if that person had traveled or been exposed to someone with COVID-19 and she said no. Called [place] who was precepting and she told me to route this to her so she can call her back.  Pt's callback number is [phone].

## 2019-03-01 NOTE — Telephone Encounter (Signed)
I called and spoke with the patient. She does not have URI symptoms and no fever. She was with her boyfriend 2 days ago who had that time was well. Today he has a cough with subjective fever.  I reassured her that she is low risk for COVID-19 and does not require testing.  I encouraged her to stay home as much as she can and practice good hand hygiene. Return precaution discussed.

## 2019-03-24 ENCOUNTER — Ambulatory Visit: Payer: Medicaid Other

## 2019-06-15 ENCOUNTER — Ambulatory Visit
Admission: RE | Admit: 2019-06-15 | Discharge: 2019-06-15 | Disposition: A | Discharge status: Home / Self Care | Payer: Medicaid Other | Source: Ambulatory Visit | Attending: Family Medicine | Admitting: Family Medicine

## 2019-06-15 ENCOUNTER — Other Ambulatory Visit: Payer: Self-pay

## 2019-06-15 DIAGNOSIS — Z1231 Encounter for screening mammogram for malignant neoplasm of breast: Secondary | ICD-10-CM | POA: Diagnosis not present

## 2019-06-20 ENCOUNTER — Other Ambulatory Visit: Payer: Self-pay | Admitting: Family Medicine

## 2019-06-20 DIAGNOSIS — R928 Other abnormal and inconclusive findings on diagnostic imaging of breast: Secondary | ICD-10-CM

## 2019-06-21 ENCOUNTER — Other Ambulatory Visit: Payer: Self-pay | Admitting: Family Medicine

## 2019-06-23 ENCOUNTER — Other Ambulatory Visit: Payer: Self-pay

## 2019-06-23 ENCOUNTER — Ambulatory Visit
Admission: RE | Admit: 2019-06-23 | Discharge: 2019-06-23 | Disposition: A | Discharge status: Home / Self Care | Payer: Medicaid Other | Source: Ambulatory Visit | Attending: Family Medicine | Admitting: Family Medicine

## 2019-06-23 DIAGNOSIS — N6012 Diffuse cystic mastopathy of left breast: Secondary | ICD-10-CM | POA: Diagnosis not present

## 2019-06-23 DIAGNOSIS — R928 Other abnormal and inconclusive findings on diagnostic imaging of breast: Secondary | ICD-10-CM

## 2019-09-11 ENCOUNTER — Other Ambulatory Visit: Payer: Self-pay | Admitting: *Deleted

## 2019-09-11 DIAGNOSIS — J309 Allergic rhinitis, unspecified: Secondary | ICD-10-CM

## 2019-09-11 DIAGNOSIS — H1010 Acute atopic conjunctivitis, unspecified eye: Secondary | ICD-10-CM

## 2019-09-11 MED ORDER — CETIRIZINE HCL 10 MG PO TABS: 10.0000 mg | ORAL_TABLET | Freq: Every day | ORAL | 3 refills | Status: DC

## 2019-10-15 IMAGING — MG DIGITAL SCREENING BILATERAL MAMMOGRAM WITH TOMO AND CAD
8 series · 9 of 24 positions shown · non-contrast
Comparison: Previous exam(s).

CLINICAL DATA: Screening.

EXAM:
DIGITAL SCREENING BILATERAL MAMMOGRAM WITH TOMO AND CAD

[R MLO synth-2D]
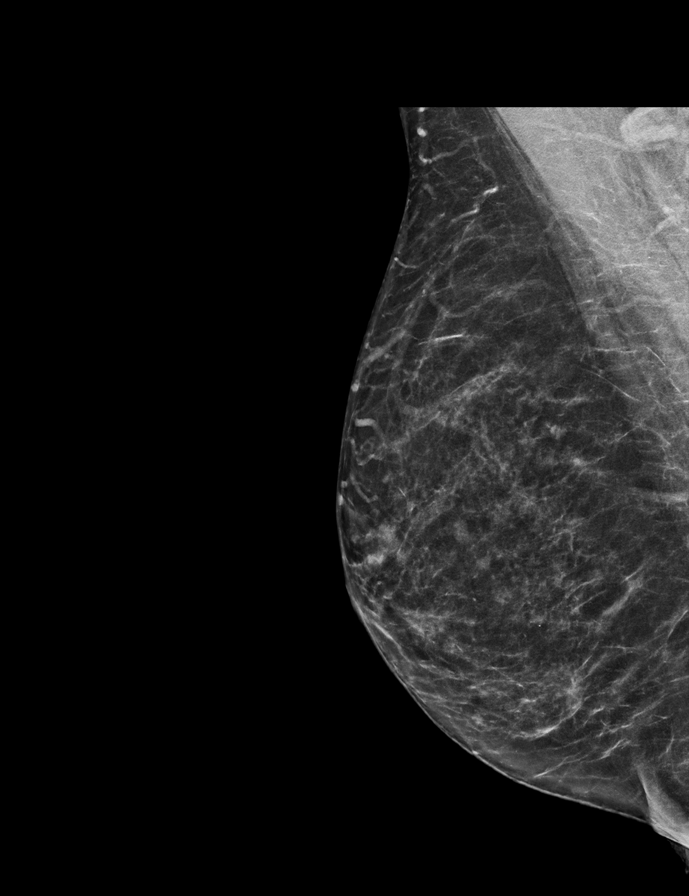

[L MLO synth-2D]
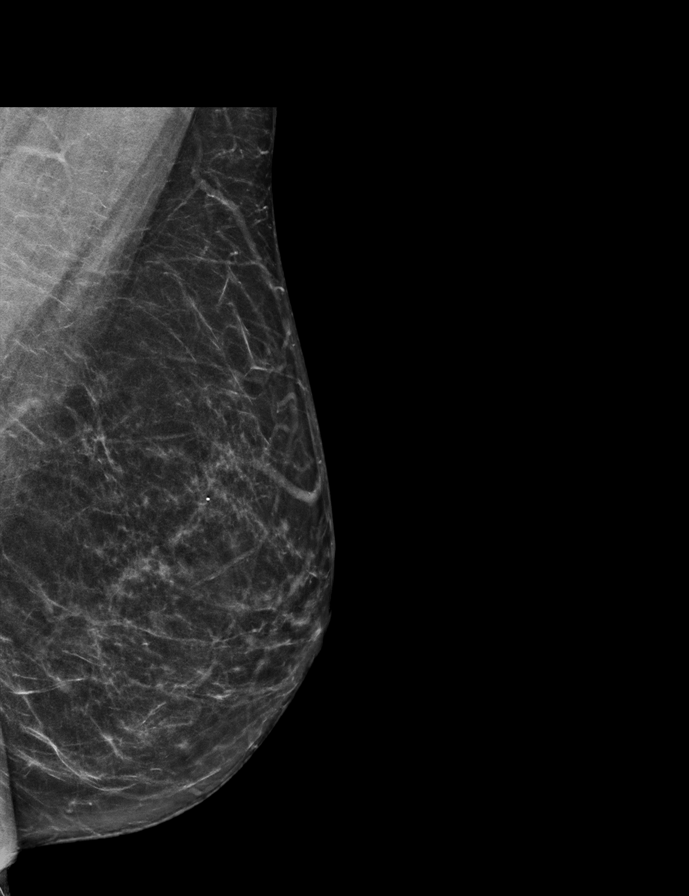

[L CC synth-2D]
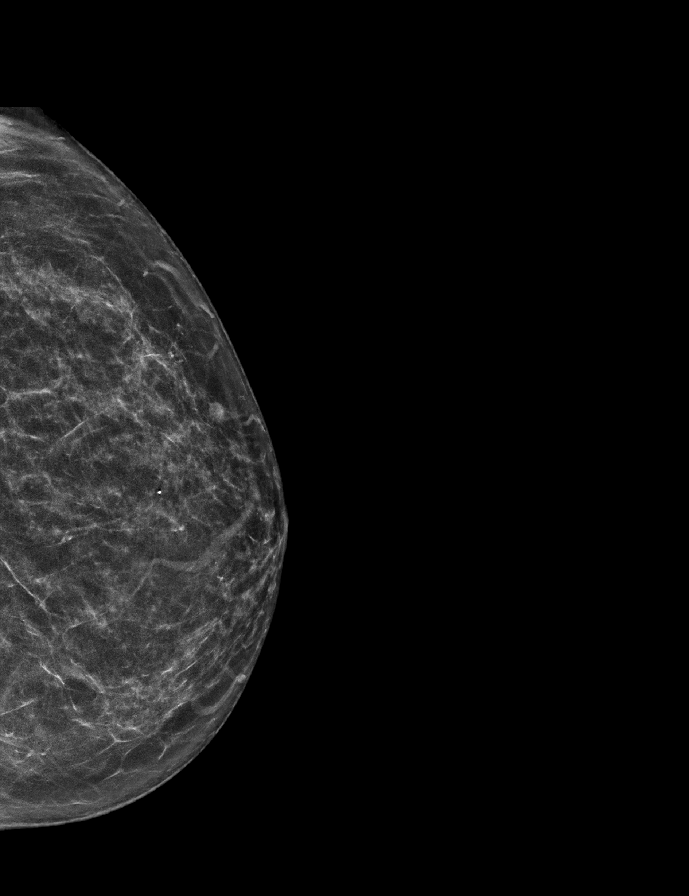

[R CC synth-2D]
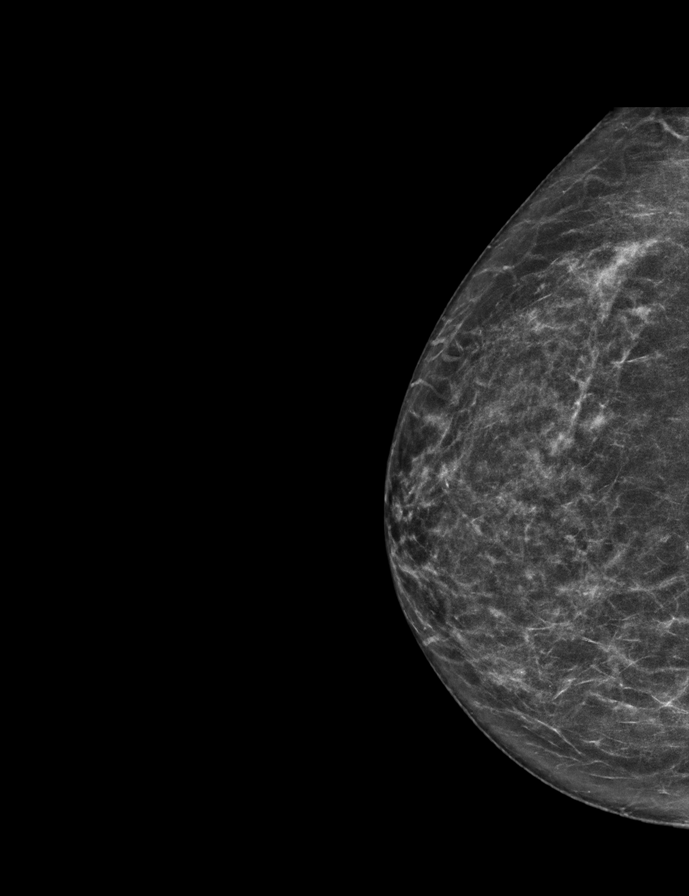

[R MLO tomo · 2 of 63 frames shown]
[frame 21/63]
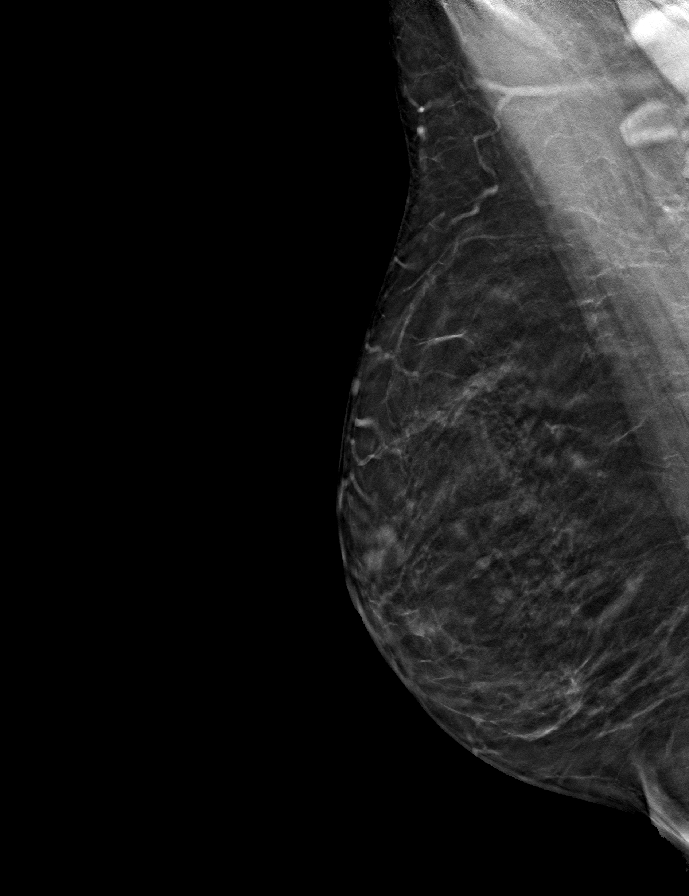
[frame 32/63]
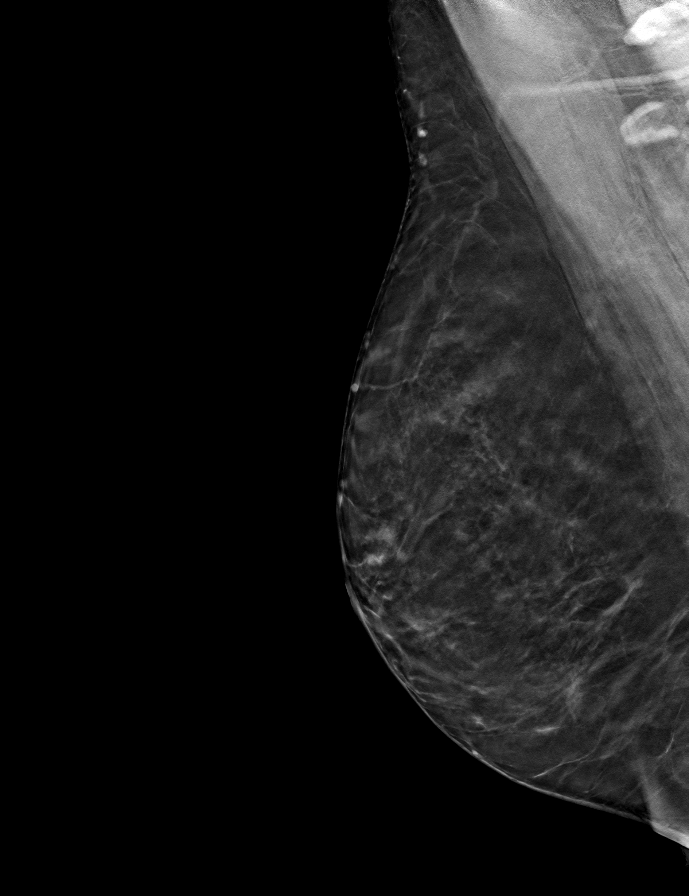

[R CC tomo · tomo slice 31/61.0]
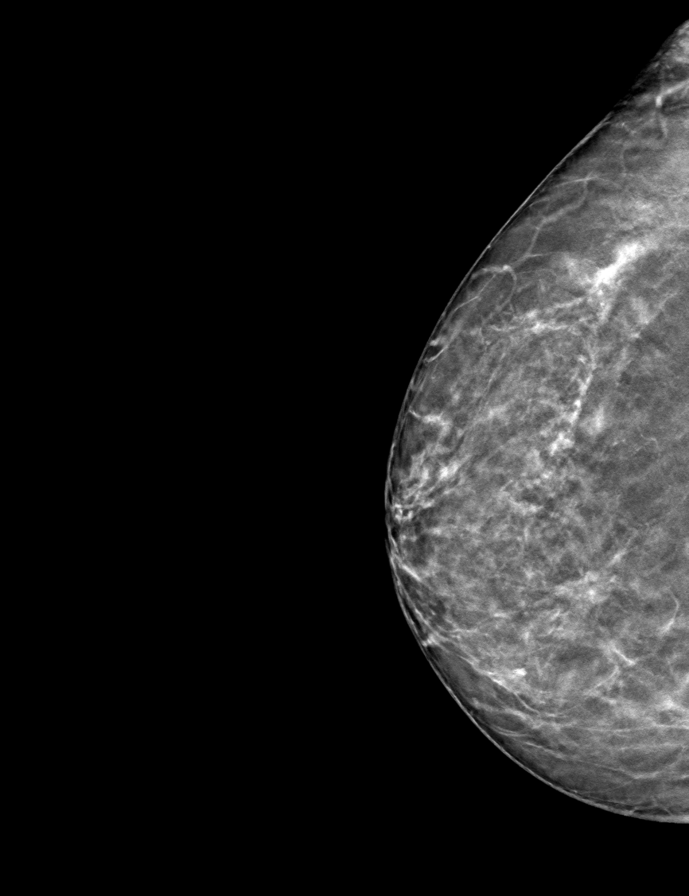

[L CC tomo · tomo slice 29/58.0]
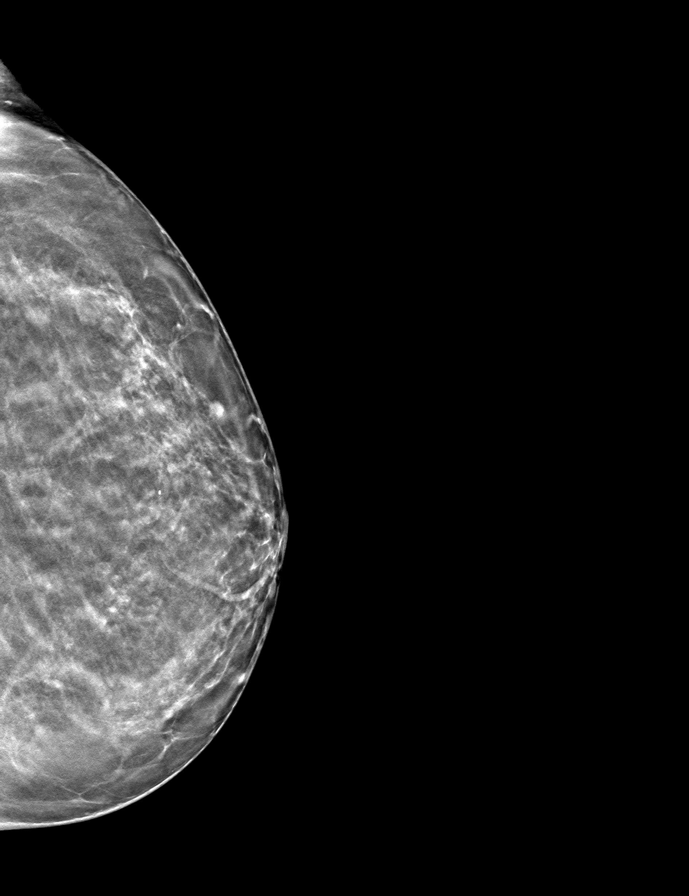

[L MLO tomo · tomo slice 31/61.0]
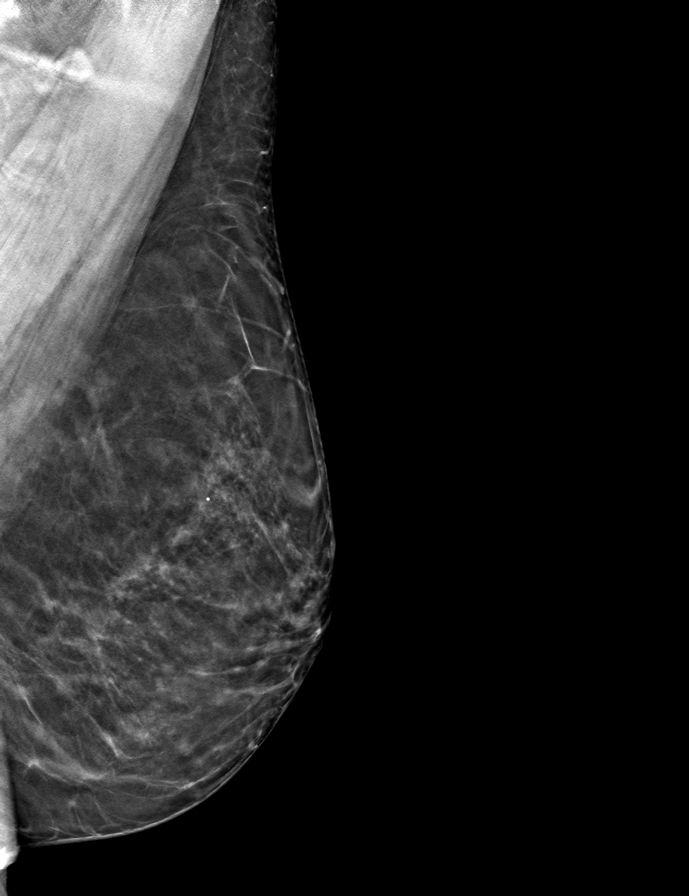

[9 of 24 positions shown; findings below may reference images not displayed]

ACR Breast Density Category b: There are scattered areas of
fibroglandular density.
FINDINGS: In the left breast, a possible mass warrants further evaluation. In
the right breast, no findings suspicious for malignancy. Images were
processed with CAD.
IMPRESSION: Further evaluation is suggested for possible mass in the left
breast.

RECOMMENDATION:
Ultrasound of the left breast. (Code:[id number])

The patient will be contacted regarding the findings, and additional
imaging will be scheduled.

BI-RADS CATEGORY  0: Incomplete. Need additional imaging evaluation
and/or prior mammograms for comparison.

## 2019-10-23 IMAGING — US ULTRASOUND LEFT BREAST LIMITED
1 series · 5 of 5 positions shown · non-contrast
Comparison: Previous exam(s).

CLINICAL DATA: Screening recall for a possible mass in the left
breast.

EXAM:
ULTRASOUND OF THE LEFT BREAST

[Series 1: ultrasound left breast limited · 0.05mm/px · 5 of 5 slices shown]
[im 1/5]
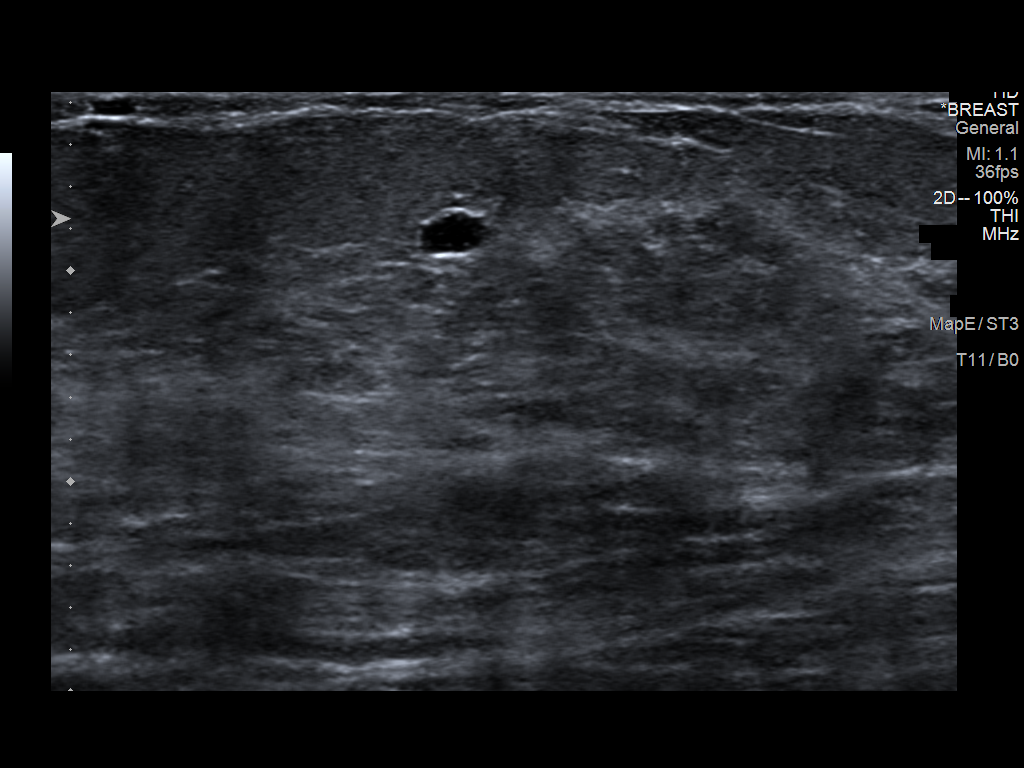
[im 2/5]
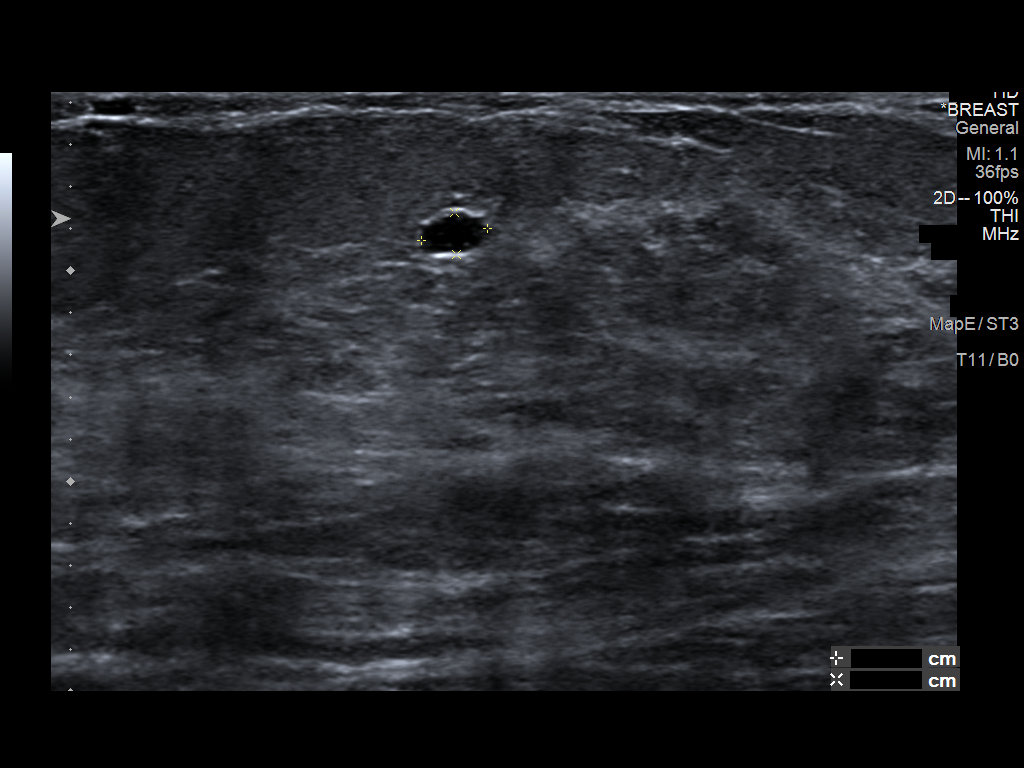
[im 3/5]
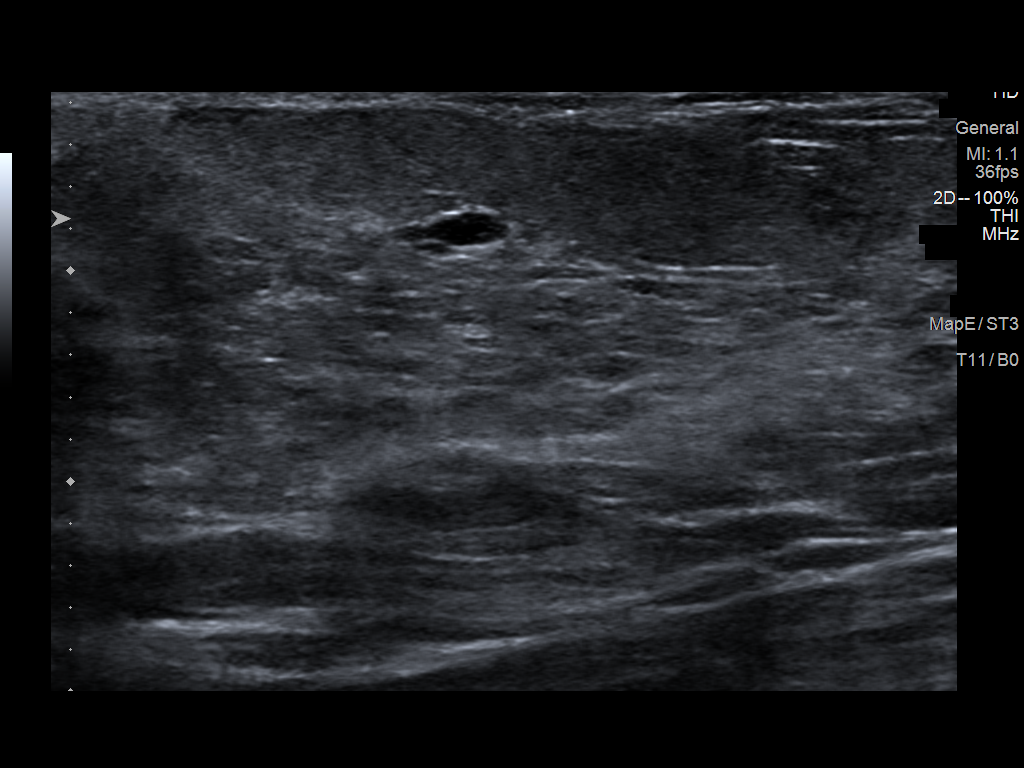
[im 4/5]
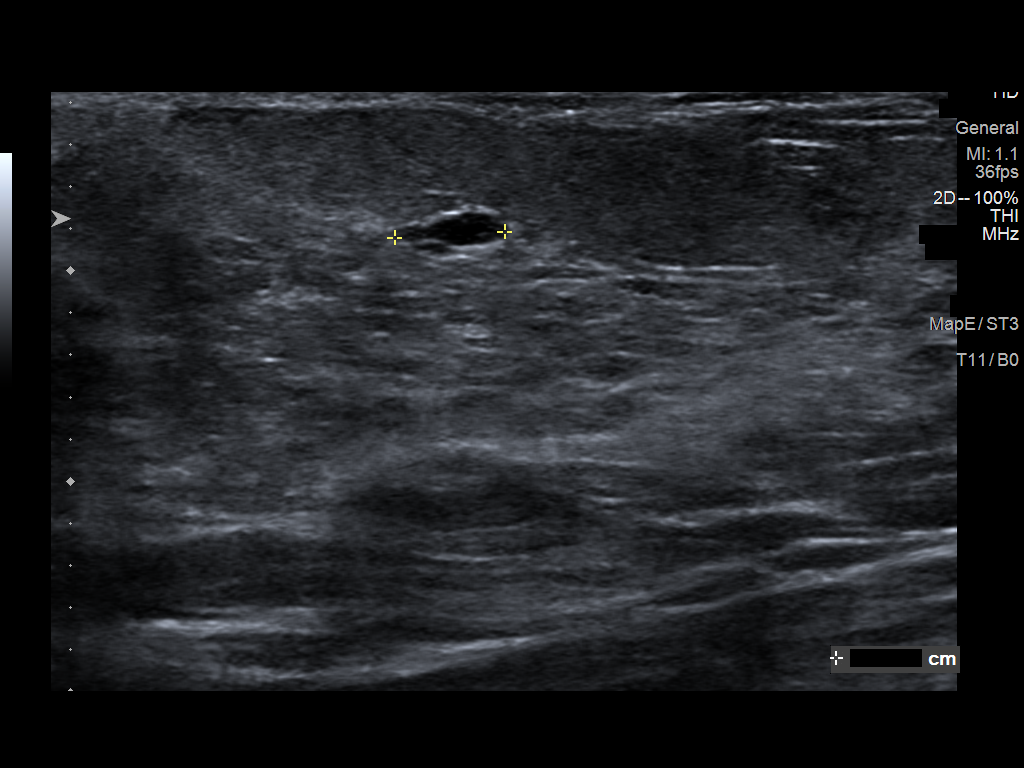
[im 5/5]
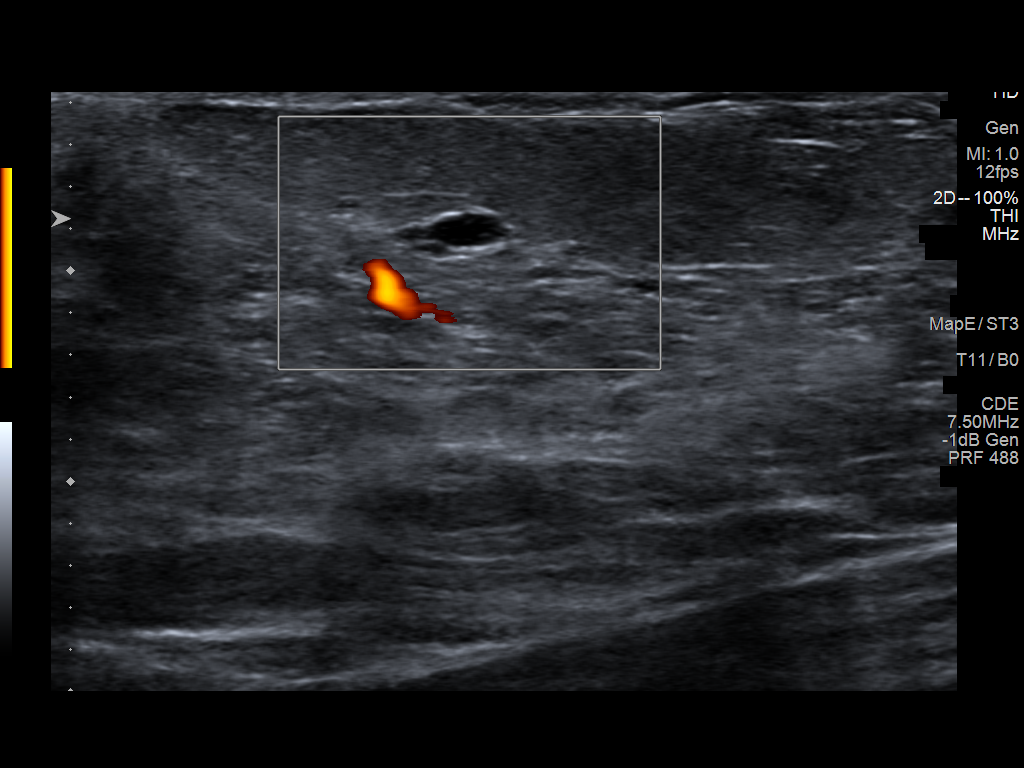

[5 of 5 positions shown; findings below may reference images not displayed]

FINDINGS: Targeted ultrasound is performed, showing a small, oval,
circumscribed cyst in the left breast at 1 o'clock, 1 cm from the
nipple, measuring 5 x 2 x 3 mm, corresponding in size, shape and
location to the mammographic mass. There are no solid masses or
suspicious lesions.
IMPRESSION: Small benign left breast cysts.  No evidence of malignancy.

RECOMMENDATION:
Screening mammogram in one year.(Code:[id number])

I have discussed the findings and recommendations with the patient.
Results were also provided in writing at the conclusion of the
visit. If applicable, a reminder letter will be sent to the patient
regarding the next appointment.

BI-RADS CATEGORY  2: Benign.

## 2020-04-17 ENCOUNTER — Other Ambulatory Visit: Payer: Self-pay | Admitting: *Deleted

## 2020-04-17 MED ORDER — VALACYCLOVIR HCL 500 MG PO TABS: 500.0000 mg | ORAL_TABLET | Freq: Two times a day (BID) | ORAL | 11 refills | Status: DC

## 2020-05-21 ENCOUNTER — Other Ambulatory Visit: Payer: Self-pay

## 2020-05-21 ENCOUNTER — Ambulatory Visit (INDEPENDENT_AMBULATORY_CARE_PROVIDER_SITE_OTHER): Payer: Medicaid Other

## 2020-05-21 DIAGNOSIS — Z111 Encounter for screening for respiratory tuberculosis: Secondary | ICD-10-CM | POA: Diagnosis not present

## 2020-05-21 NOTE — Progress Notes (Signed)
Patient is here for a PPD read.  It was placed on 05/21/2020 in the left forearm @ 10 am.    Patient scheduled for return nurse visit for PPD read on 6/10 at 10 am.    Talbot Grumbling, RN

## 2020-05-23 ENCOUNTER — Ambulatory Visit: Payer: Medicaid Other

## 2020-05-23 ENCOUNTER — Other Ambulatory Visit: Payer: Self-pay

## 2020-05-23 DIAGNOSIS — Z111 Encounter for screening for respiratory tuberculosis: Secondary | ICD-10-CM

## 2020-05-23 NOTE — Progress Notes (Signed)
PPD Reading Note PPD read and results entered in [place]. Result: 0 mm induration. Interpretation: Negative Allergic reaction: no  Letter printed and given to patient for employer.

## 2020-05-29 NOTE — Progress Notes (Signed)
SUBJECTIVE:   CHIEF COMPLAINT / HPI:   Depression Patient initially presenting for her annual wellness exam and Pap smear however patient's PHQ 2 was elevated and reflex PHQ-9 was elevated with positive #9.  Therefore appointment was changed to discuss depression.  Patient reports thoughts of "wanting to get away". Son graduated last weekend. After he graduated she realized that child support would stop. Now has no way to pay bills. Her son works and is a good kid overall. Patient currently in section 8 housing but has trouble with other bills. Is working very hard to make money. 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 Makes only a few dollars. Son pays for some bills. Is having to sell food stamps for food. Is able to get food. Lights are about to be turned off due to not having money. Is trying to find a job but wants to be self employed. Finds it hard to talk to others so they don't know she is struggling financially. Patient also dealing with physical pain 2/2 to a tumor she had. Dealing with pain x9 years. Has applied for disability but was denied. Does not like taking pain pills for pain as they make her sleep for long periods of time, was scared she would never wake up. Usually uses marijuana for pain relief. This helps with pain. Now not able to afford this for pain relief.   Reports everything is piling up all at once: unemployment, not having a significant other. Does report no plan to hurt herself. Wishes she had someone to talk to. Can't talk to her friends or family. Does not want to die. Just wants to get away from her situation.   Patient reports having thoughts of learning how to shoot a gun for protection. Using them as protection. Does not have a desire to use gun to harm herself. States she lives with son but wanted to have a form of protection at her home. Does own a gun in the house.   Mother is getting older so she is thinking of  moving to be closer to her to help care of her.    Depression screen [facility] 2/9 05/30/2020 01/02/2019 08/02/2018 04/06/2017 12/21/2016  Decreased Interest 1 0 0 0 0  Down, Depressed, Hopeless 1 0 0 0 0  PHQ - 2 Score 2 0 0 0 0  Altered sleeping 1 - - - -  Tired, decreased energy 1 - - - -  Change in appetite 3 - - - -  Feeling bad or failure about yourself  2 - - - -  Trouble concentrating 1 - - - -  Moving slowly or fidgety/restless 1 - - - -  Suicidal thoughts 2 - - - -  PHQ-9 Score 13 - - - -  Difficult doing work/chores Somewhat difficult - - - -    PERTINENT  PMH / PSH: schwannoma   OBJECTIVE:   BP 110/90   Ht 5\' 5"  (1.651 m)   Wt 138 lb 8 oz (62.8 kg)   LMP  (LMP Unknown)   BMI 23.05 kg/m   Gen: awake and alert, tearful Psych: avoidant speech, normal affect, tearful   ASSESSMENT/PLAN:   Depression, major, single episode, severe (HCC) Patient with symptoms of depression and elevated PHQ-9.  PHQ-9 #9 was positive.  Discussed with Dr. Hartford Poli.  We will plan to refer for both psychiatry and counseling.  I was able to call [facility] and  give patient information and they will call patient either today or tomorrow to set up an appointment.  I explained to them the story leading up to referral and stressed the importance of close follow-up.  Given information on suicide hotline and after visit summary and advised to call at any time of the day as it is available 24/7.  Close follow-up in clinic scheduled for Monday.  Unfortunately I am unavailable to schedule with another provider.  List of other therapists in the area also provided in AVS.  CCM referral placed as a significant portion of patient's depression is related to her financial struggles.  Hopefully once she receives some community resources her depression will improve.  Patient currently has no active thoughts of suicide or plan.  Patient verbalized again that she does not plan to use a gun to hurt herself and only uses it as protection as  she is a single female and does not feel safe in her neighborhood.  Patient was able to verbalize that she is safe to go home and will call if anything changes.  Patient advised of [facility] emergency line as well.  Follow-up on Monday.  Follow-up with psychiatry tomorrow.     Caroline More, [place]

## 2020-05-30 ENCOUNTER — Other Ambulatory Visit: Payer: Self-pay

## 2020-05-30 ENCOUNTER — Ambulatory Visit (INDEPENDENT_AMBULATORY_CARE_PROVIDER_SITE_OTHER): Payer: Medicaid Other | Admitting: Family Medicine

## 2020-05-30 ENCOUNTER — Encounter: Payer: Self-pay | Admitting: Family Medicine

## 2020-05-30 VITALS — BP 110/90 | Ht 65.0 in | Wt 138.5 lb

## 2020-05-30 DIAGNOSIS — Z23 Encounter for immunization: Secondary | ICD-10-CM | POA: Diagnosis not present

## 2020-05-30 DIAGNOSIS — Z598 Other problems related to housing and economic circumstances: Secondary | ICD-10-CM

## 2020-05-30 DIAGNOSIS — Z599 Problem related to housing and economic circumstances, unspecified: Secondary | ICD-10-CM

## 2020-05-30 DIAGNOSIS — F322 Major depressive disorder, single episode, severe without psychotic features: Secondary | ICD-10-CM | POA: Insufficient documentation

## 2020-05-30 NOTE — Assessment & Plan Note (Addendum)
Patient with symptoms of depression and elevated PHQ-9.  PHQ-9 #9 was positive.  Discussed with Dr. Hartford Poli.  We will plan to refer for both psychiatry and counseling.  I was able to call [facility] and give patient information and they will call patient either today or tomorrow to set up an appointment.  I explained to them the story leading up to referral and stressed the importance of close follow-up.  Given information on suicide hotline and after visit summary and advised to call at any time of the day as it is available 24/7.  Close follow-up in clinic scheduled for Monday.  Unfortunately I am unavailable to schedule with another provider.  List of other therapists in the area also provided in AVS.  CCM referral placed as a significant portion of patient's depression is related to her financial struggles.  Hopefully once she receives some community resources her depression will improve.  Patient currently has no active thoughts of suicide or plan.  Patient verbalized again that she does not plan to use a gun to hurt herself and only uses it as protection as she is a single female and does not feel safe in her neighborhood.  Patient was able to verbalize that she is safe to go home and will call if anything changes.  Patient advised of [facility] behavioral [facility] emergency line as well.  Follow-up on Monday.  Follow-up with psychiatry tomorrow.

## 2020-05-30 NOTE — Patient Instructions (Signed)
If you are feeling suicidal or depression symptoms worsen please immediately go to:   24 Hour Availability [facility]  [address], [place], [place] [address]  [phone] or [phone]  . If you are thinking about harming yourself or having thoughts of suicide, or if you know someone who is, seek help right away. . Call your doctor or mental health care provider. . Call 911 or go to a hospital emergency room to get immediate help, or ask a friend or family member to help you do these things. . Call the [place] National Suicide Prevention Lifeline's toll-free, 24-hour hotline at 1-800-273-TALK (931) 676-9672) or TTY: 1-800-799-4 TTY (754) 683-1843) to talk to a trained counselor. . If you are in crisis, make sure you are not left alone.  . If someone else is in crisis, make sure he or she is not left alone   [facility] the [employer] (Domestic Violence, Rape & Victim Assistance [phone]  [facility]  [address], [place]  [address]               [phone] or [phone]  [place]    (ONLY from 8am-4pm)    [phone]  [facility] Mobile Crisis Unit (24/7)   [phone] [phone]  [place] National Suicide Hotline   (337)047-8621 Diamantina Monks)  Therapy and Counseling Resources Most providers on this list will take Medicaid. Patients with commercial insurance or Medicare should contact their insurance company to get a list of in network providers.  [facility]  [address] [place], [place] [address]      [place] [address]  [place], [place] [address] [phone]  [place] [address] [place]  [place], [place] [address]       [phone]      [facility] [address], [place], [place]  Family [facility]:  [place]. [place]  [facility]:  [place] [address] Loni Muse, [place]  [facility] (under & uninsured) [address], [place] [phone]    [email or web address]    [facility] [place]     Phone:  [phone]     [place]  [place] [address]      [place], [place] [address] [place]: [place], [place], [place]   [place] ([place] therapist) [address]   [place] [address]    [phone]    [facility]   [place],  [place], [place]   [place]  [place]  [facility]  [address] [place], [place]        [phone]  Open Access/Walk In Clinic under & uninsured  [place], To schedule an appointment call [phone] [address], [place] [phone]):  [facility] from 8 AM - 3 PM Moving June 1 to [employer] at [facility] [address], [place],  ([place])   [place], [place]: [phone]) 8:30 - 12; 1 - 2:30  Family Service of the [employer],  [address] [place]    [phone]):8:30 - 12; 2 - 3PM  [facility] [employer],  [address]  [place]; [phone]):   Mon - Fri 8 AM - 5 PM  [facility] [place]  MWF 12:30 to 3:00 or call to schedule an appointment  [phone]  Specific Provider options Psychology Today  [email or web address] 1. click on find a therapist  2. enter your zip code 3. left side and select or tailor a therapist for your specific need.   [facility] Provider Directory [email or web address]  (Medicaid)   Follow  all drop down to find a provider  [place] [phone] or [email or web address] [address], [place], [place] Recovery support and educational   24- Hour Availability:  . [place] or [phone]  . Family Service of the [employer] [phone]  [facility] Crisis Service  [phone]   . [place]  [phone] (after hours)  . Therapeutic Alternative/Mobile Crisis   [phone]  . [place] National Suicide Hotline  (914)751-2744 ([place])  . Call 911 or go to emergency room  . [employer]  (732)743-8755);  [place] and [employer]   . [facility]  [phone]); [place], [place], [place], [place], [place], [place], [place]

## 2020-06-03 ENCOUNTER — Ambulatory Visit: Payer: Medicaid Other

## 2020-06-03 NOTE — Progress Notes (Deleted)
   Subjective:   Patient ID: Cynthia Orozco    DOB: [birth year], 59 y.o. female   MRN: [id number]  Cynthia Orozco is a 59 y.o. female with a history of HTN, benign meningeal tumor, schwannoma of cranial nerve, chronic tension headaches, genital herpes simplex 2, HLD, seasonal allergies, depression here for Depression Follow up.  Depression Follow Up: She is here today to follow-up for depression.  She was last seen by her PCP on 05/30/2020.  She has multiple factors that are contributing to her depression including unemployment with significant financial difficulties, chronic pain and recently denied disability, no close support system.  She does live with her son who recently graduated school. PCP referred to psychiatry and counseling, in additon to referral to CCM . PCP reached out to Dr. Everlean Alstrom who  Planned for follow up on 6/18. Patien tnotes ***   PHQ-9 score: Active thoughts? Passive thoughts?   Review of Systems:  Per HPI.   Objective:   LMP  (LMP Unknown)  Vitals and nursing note reviewed.  General: well nourished, well developed, in no acute distress with non-toxic appearance HEENT: normocephalic, atraumatic, moist mucous membranes Neck: supple, non-tender without lymphadenopathy CV: regular rate and rhythm without murmurs, rubs, or gallops, no lower extremity edema Lungs: clear to auscultation bilaterally with normal work of breathing Abdomen: soft, non-tender, non-distended, no masses or organomegaly palpable, normoactive bowel sounds Skin: warm, dry, no rashes or lesions Extremities: warm and well perfused, normal tone MSK: ROM grossly intact, strength intact, gait normal Neuro: Alert and oriented, speech normal  Assessment & Plan:   No problem-specific Assessment & Plan notes found for this encounter.  No orders of the defined types were placed in this encounter.  No orders of the defined types were placed in this encounter.   Mina Marble, DO PGY-2,  [facility] 06/03/2020 12:51 PM

## 2020-06-04 ENCOUNTER — Ambulatory Visit: Payer: Self-pay | Admitting: Licensed Clinical Social Worker

## 2020-06-04 DIAGNOSIS — Z139 Encounter for screening, unspecified: Secondary | ICD-10-CM

## 2020-06-04 NOTE — Chronic Care Management (AMB) (Signed)
   Social Work Care Management  Referral Note  06/04/2020 Name: Cynthia Orozco MRN: [id number] DOB: [birth year]  [name] is a 59 y.o. year old female who sees Caroline More, DO for primary care.  LCSW was consulted by PCP to assistance patient with [employer] .   Referral Priority: Routine (anticipate clinician appointment scheduling within 28 business days) Review of patient status, including review of consultants reports, relevant laboratory and other test results, as well as collaboration with appropriate care team members,  and the patient's provider was performed as part of comprehensive patient evaluation and provision of chronic care management services.    Recommendation: After chart review it is determined that no clinical needs are identified for LCSW.  PCP has referred patient to [facility] for medication management and ongoing counseling. Patient's community resource needs are best met by CCM care guides.   Plan:  1. Patient is being referred to the [facility] for assistance with community resources due to financial difficulties. Unable to afford bills. Unemployed as well as Food insecurity 2.   The Care Guide will contact the Care Management team if clinical needs are identified   Casimer Lanius, [place] / [place]   [phone] 11:24 AM

## 2020-06-06 ENCOUNTER — Other Ambulatory Visit: Payer: Self-pay

## 2020-06-06 DIAGNOSIS — H1010 Acute atopic conjunctivitis, unspecified eye: Secondary | ICD-10-CM

## 2020-06-06 DIAGNOSIS — J309 Allergic rhinitis, unspecified: Secondary | ICD-10-CM

## 2020-06-06 MED ORDER — CETIRIZINE HCL 10 MG PO TABS: 10.0000 mg | ORAL_TABLET | Freq: Every day | ORAL | 3 refills | Status: DC

## 2020-06-12 ENCOUNTER — Ambulatory Visit (INDEPENDENT_AMBULATORY_CARE_PROVIDER_SITE_OTHER): Payer: Medicaid Other | Admitting: Family Medicine

## 2020-06-12 ENCOUNTER — Encounter: Payer: Self-pay | Admitting: Family Medicine

## 2020-06-12 ENCOUNTER — Other Ambulatory Visit: Payer: Self-pay

## 2020-06-12 VITALS — BP 104/62 | HR 68 | Wt 139.6 lb

## 2020-06-12 DIAGNOSIS — A6000 Herpesviral infection of urogenital system, unspecified: Secondary | ICD-10-CM | POA: Diagnosis not present

## 2020-06-12 DIAGNOSIS — Z114 Encounter for screening for human immunodeficiency virus [HIV]: Secondary | ICD-10-CM | POA: Diagnosis not present

## 2020-06-12 DIAGNOSIS — F322 Major depressive disorder, single episode, severe without psychotic features: Secondary | ICD-10-CM

## 2020-06-12 DIAGNOSIS — J309 Allergic rhinitis, unspecified: Secondary | ICD-10-CM | POA: Diagnosis not present

## 2020-06-12 DIAGNOSIS — E785 Hyperlipidemia, unspecified: Secondary | ICD-10-CM | POA: Diagnosis not present

## 2020-06-12 DIAGNOSIS — L209 Atopic dermatitis, unspecified: Secondary | ICD-10-CM | POA: Diagnosis not present

## 2020-06-12 DIAGNOSIS — Z1159 Encounter for screening for other viral diseases: Secondary | ICD-10-CM

## 2020-06-12 DIAGNOSIS — Z720 Tobacco use: Secondary | ICD-10-CM | POA: Diagnosis not present

## 2020-06-12 DIAGNOSIS — Z131 Encounter for screening for diabetes mellitus: Secondary | ICD-10-CM | POA: Diagnosis not present

## 2020-06-12 DIAGNOSIS — H1010 Acute atopic conjunctivitis, unspecified eye: Secondary | ICD-10-CM | POA: Diagnosis not present

## 2020-06-12 DIAGNOSIS — Z0000 Encounter for general adult medical examination without abnormal findings: Secondary | ICD-10-CM

## 2020-06-12 DIAGNOSIS — D329 Benign neoplasm of meninges, unspecified: Secondary | ICD-10-CM

## 2020-06-12 DIAGNOSIS — Z8679 Personal history of other diseases of the circulatory system: Secondary | ICD-10-CM

## 2020-06-12 LAB — POCT GLYCOSYLATED HEMOGLOBIN (HGB A1C): Hemoglobin A1C: 4.8 % (ref 4.0–5.6)

## 2020-06-12 MED ORDER — TRIAMCINOLONE ACETONIDE 0.1 % EX CREA: 1.0000 "application " | TOPICAL_CREAM | Freq: Two times a day (BID) | CUTANEOUS | 1 refills | Status: AC | PRN

## 2020-06-12 MED ORDER — ALBUTEROL SULFATE HFA 108 (90 BASE) MCG/ACT IN AERS: 2.0000 | INHALATION_SPRAY | Freq: Four times a day (QID) | RESPIRATORY_TRACT | 2 refills | Status: DC | PRN

## 2020-06-12 NOTE — Patient Instructions (Signed)
Thank you for coming to see me today. It was a pleasure to see you.   Please be sure to call the therapist to establish care. If your symptoms worsen please feel free to call me so we can discuss other options such as medicines.  We are checking some labs today, I will call you if they are abnormal will send you a MyChart message or a letter if they are normal.  If you do not hear about your labs in the next 2 weeks please let us know.  Please follow-up with me in 1 year for annual exam, sooner if needed.   If you have any questions or concerns, please do not hesitate to call the office at [phone].  Take Care,  Dr. Mina Marble, DO Resident Physician [place] [phone]   Therapy and Counseling Resources Most providers on this list will take Medicaid. Patients with commercial insurance or Medicare should contact their insurance company to get a list of in network providers.  [facility] [address] [place], [place] [address]       [phone]     [facility] [address] [place], [place]    Jinny Blossom [facility] [address] [place], [place]  [facility]:  [place]. [place]  [facility]:  [place] [address] Loni Muse, [place]  [facility] (under & uninsured) [address] [place] [phone]    [email or web address] [place]     Phone:  [phone]     [place]  [place] [address]      [place], [place] ext [place]: [place], [place], [place]   [place] ([place] therapist) [address] [place] [address]   [place] [address]     [phone]    [facility]   [address],  [place], [place]   [place]  [place]  [facility]  [address] [place], [place]        [phone]  Open Access/Walk In Clinic under & uninsured [place],  [address] [place] [phone]):  Mon - Fri from 8 AM - 3 PM  [facility] of the [place],  ([place])   [place], [place]: [phone]) 8:30 - 12; 1 - 2:30  Family Service of the [place],  [place], [place]    ([phone]):8:30 - 12; 2 - 3PM  [facility] [employer],  [address]  [place]; [phone]):   Mon - Fri 8 AM - 5 PM  [facility] [place]  MWF 12:30 to 3:00 or call to schedule an appointment  [phone]  Specific Provider options Psychology Today  [email or web address] 1. click on find a therapist  2. enter your zip code 3. left side and select or tailor a therapist for your specific need.   [facility] Provider Directory [email or web address]  (Medicaid)   Follow all drop down to find a provider  [place] or [email or web address] [address], [place], [place] Recovery support  and educational   In home counseling [place] Telephone: [phone]  office in [place] [email or web address]   Does not take reg. Medicaid or Medicare private insurance [facility], [place] health Choice, [facility], [place], [place], [place], [place] Health Choice  24- Hour Availability:  . [place] or [phone]  . Family Service of the [employer] [phone]  [facility] Crisis Service  [phone]   . [place]  [phone] (after hours)  . [facility] Alternative/Mobile Crisis    [phone]  . [place] National Suicide Hotline  939-783-8671 ([place])  . Call 911 or go to emergency room  . [employer]  9416771446);  [place] and [employer]   . [facility] ACCESS  [phone]); [place], [place], [place], [place], [place], [place], [place]    If you are feeling suicidal or depression symptoms worsen please immediately go to:   24 Hour Availability [facility]  [address], [place], [place] [address]  [phone] or [phone]  . If you are thinking about harming yourself or having thoughts of suicide, or if you know someone who is, seek help right away. . Call your doctor or mental health care provider. . Call 911 or go to a hospital emergency room to get immediate help, or ask a friend or family member to help you do these things. . Call the [place] National Suicide Prevention Lifeline's toll-free, 24-hour hotline at 1-800-273-TALK 603-299-3584) or TTY: 1-800-799-4 TTY (847)367-9292) to talk to a trained counselor. . If you are in crisis, make sure you are not left alone.  . If someone else is in crisis, make sure he or she is not left alone   Family Service of the [employer] (Domestic Violence, Rape & Victim Assistance [phone]  [facility] [place] [facility]  [address], [place]  [address]               [phone] or [phone]  [place]    (ONLY from 8am-4pm)    [phone]  Therapeutic Alternative Mobile Crisis Unit (24/7)   [phone]  [place] National Suicide Hotline   816-874-4545 Diamantina Monks)

## 2020-06-12 NOTE — Progress Notes (Signed)
SUBJECTIVE:   Chief compliant/HPI: annual examination  Cynthia Orozco is a 59 y.o. who presents today for an annual exam.   Acute Concerns: None  History of HTN:  BP: 104/62 today. Not currently on antihypertensives. Denies any chest pain, SOB, vision changes, or headaches.   HLD: Last lipid panel below. Diet controlled. Not currently on lipid lowering medications. Denies any muscles aches or weakness.   Lab Results  Component Value Date   CHOL 231 (H) 06/12/2020   HDL 64 06/12/2020   LDLCALC 139 (H) 06/12/2020   LDLDIRECT 135 (H) 12/21/2016   TRIG 159 (H) 06/12/2020   CHOLHDL 3.6 06/12/2020   Depression: Patient seen on 6/222/21 by Dr. Tammi Klippel with elevated PHQ-9 score of 13, positive to Question #9. She was referred to psych and counseling. Patient notes Dr. Emiliano Dyer office never called her. She notes that she just has bad days or triggers to her depression and that day was just a bad day. She notes that she is feeling much better today. PHQ-9 score of 5 today, Questions #9 positive. She notes that she doesn't wish she was "dead", just wishes she 'wasn't here. Wish she could be somewhere else, on vacation, in a better setting". Denies active or passive SI/HI. As previously mentioned patient does own a gun for protection and has considered using it once before, this was over a month ago.  She has not had this thought again.  She notes that she has a good support system with many friends and family that she could call in an emergency.  She notes that she is a lot of people that depend on her which helps her stay focused on the positives.  Tobacco Use; Smoked 2-3 cigarettes a day for a few years. Stopped cigarettes and now smokes one Black n Mild every other weekend. Tobacco use history of ~10 years. 3 pack year history.  Social History: LMP: 20 years ago Contraception: Menopuasal Alcohol: 1 glass of wine occasionally Tobacco: 1 Black n mild 2x/month Illicit Drugs:  None Safe at home: yes Exercise: walks regularly  History tabs reviewed and updated.  Review of systems form reviewed and are listed as above.  OBJECTIVE:   BP 104/62    Pulse 68    Wt 139 lb 9.6 oz (63.3 kg)    LMP  (LMP Unknown)    SpO2 99%    BMI 23.23 kg/m   General: Pleasant middle-aged female sitting comfortably on exam chair, well nourished, well developed, in no acute distress with non-toxic appearance HEENT: normocephalic, atraumatic, moist mucous membranes, oropharynx without erythema or exudate, tympanic membranes normal bilaterally Neck: supple, normal ROM CV: regular rate and rhythm without murmurs, rubs, or gallops, no lower extremity edema Lungs: clear to auscultation bilaterally with normal work of breathing Abdomen: soft, non-tender, non-distended, normoactive bowel sounds Skin: warm, dry, scaly erythematous rash with mild excoriations located at right antecubital fossa Extremities: warm and well perfused, normal tone MSK:  gait normal Neuro: Alert and oriented, speech normal Psych:  Conversant and pleasant. Cognition and judgment appear intact. Alert, communicative  and cooperative with normal attention span and concentration. No apparent delusions, illusions, hallucinations.   Depression screen [facility] 2/9 06/14/2020 06/12/2020 05/30/2020  Decreased Interest 1 1 1   Down, Depressed, Hopeless 1 1 1   PHQ - 2 Score 2 2 2   Altered sleeping 0 - 1  Tired, decreased energy 0 - 1  Change in appetite 0 - 3  Feeling bad or failure about yourself  1 - 2  Trouble concentrating 0 - 1  Moving slowly or fidgety/restless 1 - 1  Suicidal thoughts 1 - 2  PHQ-9 Score 5 - 13  Difficult doing work/chores - - Somewhat difficult   ASSESSMENT/PLAN:   Annual Exam: Patient here today for annual exam. PMH, surgical history, and social history were reviewed. The following concerns below were discussed.   Genital herpes simplex type 2 Controlled on suppressive therapy.  Continue Valtrex  daily.  Hyperlipidemia Elevated LDL of 135, cholesterol 231, triglycerides 159.  Not currently on any lipid-lowering medications. The 10-year ASCVD risk score Mikey Bussing DC Brooke Bonito., et al., 2013) is: 4.1%   Values used to calculate the score:     Age: 23 years     Sex: Female     Is Non-Hispanic African American: Yes     Diabetic: No     Tobacco smoker: Yes     Systolic Blood Pressure: 500 mmHg     Is BP treated: No     HDL Cholesterol: 64 mg/dL     Total Cholesterol: 231 mg/dL Recommended lifestyle modifications including avoidance of fried/fatty foods, weight loss, and consuming more lean meats and healthy fats (avocados, mixed nuts, olive oil).  If persistent consider starting statin therapy.  Atopic dermatitis Appears most consistent with atopic dermatitis. Discussed pathophysiology and recommended treatment including:   - Bathe and soak for 10 minutes in warm water once a day. Pat dry.   - Use mild, unscented soap for bathing - Immediately apply the below cream/ointment prescribed to red areas only. Wait 10 minutes and then apply moisturizers like Eucerin, Lubriderm, Cetaphil or Aquaphor twice a day all over.  - Moisturize skin 1-2x/day every day   To red/inflamed areas on the body (below the face and neck), apply:  Triamcinolone 0.1 % ointment twice a day as needed.  With ointments be careful to avoid the armpits and groin area.  Make a note of any foods that make eczema worse. Keep finger nails trimmed. When washing clothes, use a fragrance-free laundry detergent Recommend use of Air humidifier   Tobacco use Smoked 2-3 cigarettes a day for a few years. Stopped cigarettes and now smokes one Black n Mild every other weekend. Tobacco use history of ~10 years. 3 pack year history. Does not qualify for lung cancer screen at this time. Patient was counseled on the risks of tobacco use and cessation strongly encouraged.  Depression, major, single episode, severe (HCC) Chronic, improving.  PHQ-9 score 18>5 with positive to question #9.  Patient never followed up with psychiatry.  She notes improvement in her symptoms.  Denies any active or passive SI/HI, just wishes she could be somewhere else.  Highly recommended she establish care with a therapist as I believe this would be helpful for her.  She was amendable to this and resources were provided. Safety plan discussed with the patient. They are aware of how to contact crisis services if need be and instructed to go to the nearest emergency room if they feel they are in imminent danger of harming themselves and or others, or symptoms are of out control or unbearable.    Annual Examination  See AVS for age appropriate recommendations.  PHQ score 5, reviewed and discussed.  Blood pressure value is at goal.  Considered the following screening exams based upon USPSTF recommendations: Diabetes screening: ordered =- A1C 4.8 Screening for elevated cholesterol: ordered HIV testing: ordered - negative Hepatitis C: ordered - negative Hepatitis B: Not high risk  Syphilis if at high risk: Not high risk Reviewed risk factors for latent tuberculosis and not indicated Colorectal cancer screening: up to date on screening for CRC. Lung cancer screening: declined   Follow up in 1 year or sooner if indicated.   papsmear at [place], [place], PGY3 06/14/2020 11:39 AM

## 2020-06-13 LAB — COMPREHENSIVE METABOLIC PANEL
ALT: 13 IU/L (ref 0–32)
AST: 19 IU/L (ref 0–40)
Albumin/Globulin Ratio: 1.3 (ref 1.2–2.2)
Albumin: 4.5 g/dL (ref 3.8–4.9)
Alkaline Phosphatase: 125 IU/L — ABNORMAL HIGH (ref 48–121)
BUN/Creatinine Ratio: 17 (ref 9–23)
BUN: 18 mg/dL (ref 6–24)
Bilirubin Total: 0.8 mg/dL (ref 0.0–1.2)
CO2: 20 mmol/L (ref 20–29)
Calcium: 9.9 mg/dL (ref 8.7–10.2)
Chloride: 102 mmol/L (ref 96–106)
Creatinine, Ser: 1.09 mg/dL — ABNORMAL HIGH (ref 0.57–1.00)
GFR calc Af Amer: 65 mL/min/{1.73_m2} (ref >59)
GFR calc non Af Amer: 56 mL/min/{1.73_m2} — ABNORMAL LOW (ref >59)
Globulin, Total: 3.4 g/dL (ref 1.5–4.5)
Glucose: 78 mg/dL (ref 65–99)
Potassium: 3.9 mmol/L (ref 3.5–5.2)
Sodium: 146 mmol/L — ABNORMAL HIGH (ref 134–144)
Total Protein: 7.9 g/dL (ref 6.0–8.5)

## 2020-06-13 LAB — LIPID PANEL
Chol/HDL Ratio: 3.6 ratio (ref 0.0–4.4)
Cholesterol, Total: 231 mg/dL — ABNORMAL HIGH (ref 100–199)
HDL: 64 mg/dL (ref >39)
LDL Chol Calc (NIH): 139 mg/dL — ABNORMAL HIGH (ref 0–99)
Triglycerides: 159 mg/dL — ABNORMAL HIGH (ref 0–149)
VLDL Cholesterol Cal: 28 mg/dL (ref 5–40)

## 2020-06-13 LAB — HIV ANTIBODY (ROUTINE TESTING W REFLEX): HIV Screen 4th Generation wRfx: NONREACTIVE

## 2020-06-13 LAB — HEPATITIS C ANTIBODY: Hep C Virus Ab: <0.1 s/co ratio (ref 0.0–0.9)

## 2020-06-14 ENCOUNTER — Encounter: Payer: Self-pay | Admitting: Family Medicine

## 2020-06-14 DIAGNOSIS — L209 Atopic dermatitis, unspecified: Secondary | ICD-10-CM | POA: Insufficient documentation

## 2020-06-14 DIAGNOSIS — Z720 Tobacco use: Secondary | ICD-10-CM | POA: Insufficient documentation

## 2020-06-14 NOTE — Assessment & Plan Note (Signed)
Controlled on suppressive therapy.  Continue Valtrex daily.

## 2020-06-14 NOTE — Assessment & Plan Note (Signed)
Chronic, improving. PHQ-9 score 18>5 with positive to question #9.  Patient never followed up with psychiatry.  She notes improvement in her symptoms.  Denies any active or passive SI/HI, just wishes she could be somewhere else.  Highly recommended she establish care with a therapist as I believe this would be helpful for her.  She was amendable to this and resources were provided. Safety plan discussed with the patient. They are aware of how to contact crisis services if need be and instructed to go to the nearest emergency room if they feel they are in imminent danger of harming themselves and or others, or symptoms are of out control or unbearable.

## 2020-06-14 NOTE — Assessment & Plan Note (Signed)
Appears most consistent with atopic dermatitis. Discussed pathophysiology and recommended treatment including:   - Bathe and soak for 10 minutes in warm water once a day. Pat dry.   - Use mild, unscented soap for bathing - Immediately apply the below cream/ointment prescribed to red areas only. Wait 10 minutes and then apply moisturizers like Eucerin, Lubriderm, Cetaphil or Aquaphor twice a day all over.  - Moisturize skin 1-2x/day every day   To red/inflamed areas on the body (below the face and neck), apply:  Triamcinolone 0.1 % ointment twice a day as needed.  With ointments be careful to avoid the armpits and groin area.  Make a note of any foods that make eczema worse. Keep finger nails trimmed. When washing clothes, use a fragrance-free laundry detergent Recommend use of Air humidifier

## 2020-06-14 NOTE — Assessment & Plan Note (Signed)
Smoked 2-3 cigarettes a day for a few years. Stopped cigarettes and now smokes one Black n Mild every other weekend. Tobacco use history of ~10 years. 3 pack year history. Does not qualify for lung cancer screen at this time. Patient was counseled on the risks of tobacco use and cessation strongly encouraged.

## 2020-06-14 NOTE — Assessment & Plan Note (Signed)
Elevated LDL of 135, cholesterol 231, triglycerides 159.  Not currently on any lipid-lowering medications. The 10-year ASCVD risk score Mikey Bussing DC Brooke Bonito., et al., 2013) is: 4.1%   Values used to calculate the score:     Age: 59 years     Sex: Female     Is Non-Hispanic African American: Yes     Diabetic: No     Tobacco smoker: Yes     Systolic Blood Pressure: 835 mmHg     Is BP treated: No     HDL Cholesterol: 64 mg/dL     Total Cholesterol: 231 mg/dL Recommended lifestyle modifications including avoidance of fried/fatty foods, weight loss, and consuming more lean meats and healthy fats (avocados, mixed nuts, olive oil).  If persistent consider starting statin therapy.

## 2020-06-21 ENCOUNTER — Telehealth: Payer: Self-pay | Admitting: Family Medicine

## 2020-06-21 DIAGNOSIS — R7989 Other specified abnormal findings of blood chemistry: Secondary | ICD-10-CM

## 2020-06-21 NOTE — Telephone Encounter (Signed)
Spoke to patient about results. Elevated creatinine without real cause. She only uses Excedrin occasionally otherwise denies any use of other NSAID, ASA. Not currently on any other nephrotoxic medications. Will have her come in for repeat BMP in 2 weeks to monitor. Recommended avoiding NSAIDs/ASA and only use tylenol PRN. Patient scheduled for lab visit on 7/22 for repeat BMP. If remains elevated, will have patient come in for further evaluation. Patient voiced understanding and agreement with plan.

## 2020-07-04 ENCOUNTER — Other Ambulatory Visit: Payer: Self-pay

## 2020-07-04 ENCOUNTER — Other Ambulatory Visit: Payer: Medicaid Other

## 2020-07-04 DIAGNOSIS — R7989 Other specified abnormal findings of blood chemistry: Secondary | ICD-10-CM

## 2020-07-05 LAB — BASIC METABOLIC PANEL
BUN/Creatinine Ratio: 17 (ref 9–23)
BUN: 16 mg/dL (ref 6–24)
CO2: 23 mmol/L (ref 20–29)
Calcium: 9.4 mg/dL (ref 8.7–10.2)
Chloride: 106 mmol/L (ref 96–106)
Creatinine, Ser: 0.92 mg/dL (ref 0.57–1.00)
GFR calc Af Amer: 80 mL/min/{1.73_m2} (ref >59)
GFR calc non Af Amer: 69 mL/min/{1.73_m2} (ref >59)
Glucose: 95 mg/dL (ref 65–99)
Potassium: 3.8 mmol/L (ref 3.5–5.2)
Sodium: 142 mmol/L (ref 134–144)

## 2020-07-22 ENCOUNTER — Telehealth: Payer: Self-pay | Admitting: *Deleted

## 2020-07-22 NOTE — Telephone Encounter (Signed)
Patient called and states that she has been having increased issues with her current housing situation.  She resides on the third floor at [facility].  Patient states that she has been having problems climbing 3 flights of stairs because there is no elevator.  Her ankles and breathing make this more difficult.  She is asking that provider write a letter asking her apartment manager to move her to something closer to ground level.  Will forward to MD.  Johnney Ou

## 2020-08-06 ENCOUNTER — Telehealth: Payer: Self-pay | Admitting: Family Medicine

## 2020-08-06 NOTE — Telephone Encounter (Signed)
° °  [facility] 08/06/2020    Name: Cynthia Orozco    MRN: [id number]    DOB: 23-Mar-1961    AGE: 59 y.o.    GENDER: female    PCP Mullis, Kiersten P, DO.   Called pt regarding [employer] Referral for food insecurity, employment, and financial needs. Left message for patient to give Care Guide a call.   Follow up on: 08/07/2020  [place], Care Management Phone: [phone] Email: [email or web address]

## 2020-08-08 ENCOUNTER — Telehealth: Payer: Self-pay | Admitting: Family Medicine

## 2020-08-08 ENCOUNTER — Other Ambulatory Visit: Payer: Self-pay | Admitting: Family Medicine

## 2020-08-08 DIAGNOSIS — H1010 Acute atopic conjunctivitis, unspecified eye: Secondary | ICD-10-CM

## 2020-08-08 MED ORDER — ALBUTEROL SULFATE HFA 108 (90 BASE) MCG/ACT IN AERS: 2.0000 | INHALATION_SPRAY | Freq: Four times a day (QID) | RESPIRATORY_TRACT | 2 refills | Status: DC | PRN

## 2020-08-08 NOTE — Telephone Encounter (Signed)
Spoke to patient in regards to her concerns living on the third floor and her endorsement of SOB.  She notes that she lives in section 8 housing and has been recently notified that the apartment complex is no longer qualified as "section 8".  She notes that she is grandfathered in and will be allowed to stay however she will not be able to move to a different floor.  In regards to her breathing she notes that she occasionally gets short of breath, primarily with long distance walking.  She has been out of her albuterol inhaler.  Her car is currently in the shop and is now having to walk everywhere, including work every day.  She notes that she has to stop once, about halfway (which is about 2 blocks) to rest.  She denies any chest pain, palpitations, or leg swelling.   Per chart review she does not appear to have any PFTs on file or official diagnosis of asthma/COPD.  She does endorse a family history of asthma and COPD, including her mother and grandmother, and her father had lung cancer from a long history of smoking.  She notes that she smokes occasional black in milds but does not inhale.   We will provide refill of albuterol inhaler.  Recommended that she schedule an appointment with me at earliest convenience to further evaluate her shortness of breath.  Would likely benefit from PFTs for official evaluation of lung function.  Discussed red flag symptoms and strict return precautions including development of chest pain, lower extremity swelling, or abnormal heartbeat.  Patient voiced understanding and agreement with plan.

## 2020-08-08 NOTE — Telephone Encounter (Signed)
Spoke with Cynthia Orozco today she stated that she is out of her medicine for her inhaler and needs a re-fill on her albuterol and she also stated that she is  having pain in her back and wanted to know if there are any pain patches that she can use to help relieve the pain.

## 2020-08-09 ENCOUNTER — Other Ambulatory Visit: Payer: Self-pay | Admitting: Family Medicine

## 2020-08-09 DIAGNOSIS — Z1231 Encounter for screening mammogram for malignant neoplasm of breast: Secondary | ICD-10-CM

## 2020-08-09 NOTE — Telephone Encounter (Signed)
   [facility] 08/09/2020   Name: Cynthia Orozco   MRN: [id number]   DOB: June 22, 1961   AGE: 59 y.o.   GENDER: female   PCP Mullis, Kiersten P, DO.   Spoke with Ms. Cynthia Orozco on 08/06/20 regarding referral. Patient state that she is still in need for financial assistance. She stated that she received an $80 Utility Check from [employer] to cover her utilities. She also stated that she used to receive child support, but once her son graduated in June 2021, the child support stopped. Ms. Cynthia Orozco works part-time three days a week and is paid bi-weekly. Patient asked if there are any financial resources available that can assist her she would greatly appreciate it.   Ms. Cynthia Orozco also stated that she is running low on her medication for her inhaler. She stated that since her car is in the shop she has to walk to work and she is out of breath constantly. She also stated that she is having back pain and ask if there are any pain patches that she can use for the pain.  Care Guide sent message to CMA [place] on 08/06/20 to assist patient.   Ms. Cynthia Orozco stated that her apartment complex denied her request to be placed on a lower level. They stated that they are no longer accepting Section 8, but individuals who currently live on the property with Section 8 will be able to stay in their current apartments.    [place], Care Management Phone: [phone] Email: [email or web address]

## 2020-08-09 NOTE — Telephone Encounter (Signed)
° °  [facility] 08/09/2020    Name: Cynthia Orozco    MRN: [id number]    DOB: 04/20/1961    AGE: 59 y.o.    GENDER: female    PCP Mullis, Kiersten P, DO.   Called pt regarding [employer] Referral for financial assistance. Gave patient information for [employer] [facility]. Patient stated that she has worked with that organization in the past and is familiar with their process. 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  Patient has no additional needs at this time. Informed patient to give office a call if she has any additional needs arise.   Closing referral pending any other needs of patient.     [place], Care Management Phone: [phone] Email: [email or web address]

## 2020-08-12 ENCOUNTER — Other Ambulatory Visit: Payer: Self-pay | Admitting: Family Medicine

## 2020-08-12 DIAGNOSIS — J309 Allergic rhinitis, unspecified: Secondary | ICD-10-CM

## 2020-08-12 DIAGNOSIS — M549 Dorsalgia, unspecified: Secondary | ICD-10-CM

## 2020-08-12 MED ORDER — LIDOCAINE 5 % EX PTCH: 1.0000 | MEDICATED_PATCH | CUTANEOUS | 0 refills | Status: AC

## 2020-08-12 MED ORDER — ALBUTEROL SULFATE HFA 108 (90 BASE) MCG/ACT IN AERS: 2.0000 | INHALATION_SPRAY | Freq: Four times a day (QID) | RESPIRATORY_TRACT | 2 refills | Status: DC | PRN

## 2020-08-12 NOTE — Telephone Encounter (Signed)
Please inform patient I have called in refills for her albuterol inhaler. I have also called in Lidocaine patches to help with her back pain.

## 2020-08-13 ENCOUNTER — Telehealth: Payer: Self-pay

## 2020-08-13 NOTE — Telephone Encounter (Signed)
Received fax from pharmacy, PA needed on Lidocaine Patches. Clinical questions submitted via Cover My Meds. Waiting on response, could take up to 72 hours.  Cover My Meds info: Key: [id number]

## 2020-08-13 NOTE — Telephone Encounter (Signed)
Pt informed. Kaytlyn Din, CMA  

## 2020-08-27 ENCOUNTER — Ambulatory Visit: Payer: Medicaid Other

## 2020-09-11 ENCOUNTER — Other Ambulatory Visit: Payer: Self-pay

## 2020-09-11 ENCOUNTER — Ambulatory Visit (INDEPENDENT_AMBULATORY_CARE_PROVIDER_SITE_OTHER): Payer: Medicaid Other | Admitting: Family Medicine

## 2020-09-11 ENCOUNTER — Encounter: Payer: Self-pay | Admitting: Family Medicine

## 2020-09-11 VITALS — BP 144/100 | HR 66 | Ht 65.0 in | Wt 139.2 lb

## 2020-09-11 DIAGNOSIS — Z915 Personal history of self-harm: Secondary | ICD-10-CM | POA: Diagnosis not present

## 2020-09-11 DIAGNOSIS — Z79899 Other long term (current) drug therapy: Secondary | ICD-10-CM | POA: Diagnosis not present

## 2020-09-11 DIAGNOSIS — R519 Headache, unspecified: Secondary | ICD-10-CM | POA: Diagnosis not present

## 2020-09-11 DIAGNOSIS — R748 Abnormal levels of other serum enzymes: Secondary | ICD-10-CM | POA: Diagnosis not present

## 2020-09-11 DIAGNOSIS — Z9151 Personal history of suicidal behavior: Secondary | ICD-10-CM

## 2020-09-11 NOTE — Progress Notes (Signed)
SUBJECTIVE:   CHIEF COMPLAINT / HPI:   Facial Pain  S/p skull base approach orbitocraniotomy for resection on 07/01/2011 & gamma knife radiosurgery on 11/01/2014.  Patient with history of chronic faial pain on the left side of her face after these surgeries.  She describes pain as a taser-like pain that occurs around her temporal area, cheek, under her mandible.  Specifically under her mandible, she does feel like something stabbing her deep in her neck.  Patient reports this pain is on and off since her surgery.  It has been worsening recently.  Patient reports recently putting black seed oil on her face for the pain which resulted in worsening excruciating pain afterwards.  Patient reports exacerbation of pain since. She has been taking excedrin extra strength that helps with the pain, 2 pills q 4 hours. Sees her neurologist annually since her surgery and in 2020 switched to biannual visits. She tried to call Dr. Redmond Pulling and LVM with no response back. He gave her these return precautions in the past.  Patient needs a doctors note stating that she needs transportation with [facility] .    History of suicidal ideation/attempt Patient's PHQ-9 today is 5.  Her interval #9 is 1.  Patient does report thoughts of killing herself.  Her last attempt was a couple months ago and put a gun to her head.  She reports that she has had 2-3 suicide attempts in the last year all of which involved her gun.  She reported that "the spirit intervenes" during those times which kept her from pulling the trigger.  She reports a friend would call or somebody would knock the door.  Patient reports her last suicidal thoughts were a couple days ago.  She reports that the thoughts come every now and again.  She denies any SI today.  She reports being depressed due to her car.  She recently had up on her gun to get her car back.  She does report wanting to get the gun back in the future.  She reports that she lives at home  alone.  Her son recently moved out.  She reports that she had mental health therapy in the past that has none currently.  She reports not being able to get to a counselor due to not having a car at this time.  Patient reports that she does not like talking to others about the feelings of suicide.  She does report that she is very involved in her community, she helps to take care of kids during the day in her home, and that the thoughts are generally fleeting.  She is noted to laugh several times during the interview today when speaking about suicide.  She also reports marijuana use with helps her calm down when she is feeling very stressed.  Patient denies any recent suicide plans and reports no other means since pawning her gun.  PERTINENT  PMH / PSH: hx suicide attempt  OBJECTIVE:   BP (!) 144/100   Pulse 66   Ht 5\' 5"  (1.651 m)   Wt 139 lb 3.2 oz (63.1 kg)   LMP  (LMP Unknown)   SpO2 100%   BMI 23.16 kg/m   General: well appearing female  HEENT: Asymetry of structural features, left appears less full than right side of face. CN intact, with exception of sensory asymetry of right and left face. Sensation is intact on left side but patient reports that it "tickles" compared to light touch of  the right face.  Psych: Patient is well groomed with good hygiene.  Speech is normal with normal rate, latency, volume and intonation.  Behavior is normal with normal eye contact.  Patient is friendly, cooperative.  Tight and logical thought process is.  No abnormal thought content.  She laughs when discussing previous suicidal ideation. Nonchalance when speaking of transient SI previously and that the thoughts "come and go" all of the time.  ASSESSMENT/PLAN:   Left facial pain Given this pain has been present since surgery in 2012, this is likely more so neuropathic pain having to do with this.  Have called patient's neurosurgeon office to schedule follow-up visit.  They will call her to make a visit.  We  will also check CMP given patient's heavy Excedrin use.  History of suicide attempt Discussed this case with Dr. Hartford Poli, Dr. Andria Frames, Dr. Nori Riis.  Plan for this patient  Referral to CCM, which patient is agreeable with.  Transportation help as she does not have a car at this time  Connection to therapy or counseling  We will not start any medications today but will consider in the future.   Recommended that patient follow-up with [place] closely while she obtains resources for therapy and counseling.  Provided information for therapy, counselors, suicide hotline     Wilber Oliphant, MD [place]

## 2020-09-11 NOTE — Patient Instructions (Addendum)
I spoke to Dr. Dois Davenport office and they will be calling you for possible MRI follow-up and to schedule an appointment.  For the pain, you can continue the Excedrin.   Support in a Crisis What if I or someone I know is in crisis?  . If you are thinking about harming yourself or having thoughts of suicide, or if you know someone who is, seek help right away. . Call your doctor or mental health care provider. . Call 911 or go to a hospital emergency room to get immediate help, or ask a friend or family member to help you do these things. . Call the [place] National Suicide Prevention Lifeline's toll-free, 24-hour hotline at 1-800-273-TALK (812)477-4688) or TTY: 1-800-799-4 TTY 508-204-1258) to talk to a trained counselor. . If you are in crisis, make sure you are not left alone.  . If someone else is in crisis, make sure he or she is not left alone  24 Hour Availability [facility]  [address] , [place], [place] [address]  [phone]  For Crisis: [phone]   [facility] [employer] (Domestic Violence, Rape & Victim Assistance [phone]  [facility]  [address], [place]  [address]               [phone] or [phone]  [place]    (ONLY from 8am-4pm)    [phone]  [facility] (24/7)   [phone]  [place] National Suicide Hotline   [phone] Diamantina Monks)

## 2020-09-12 ENCOUNTER — Telehealth: Payer: Self-pay | Admitting: *Deleted

## 2020-09-12 ENCOUNTER — Encounter: Payer: Self-pay | Admitting: Family Medicine

## 2020-09-12 DIAGNOSIS — R519 Headache, unspecified: Secondary | ICD-10-CM | POA: Insufficient documentation

## 2020-09-12 DIAGNOSIS — Z9151 Personal history of suicidal behavior: Secondary | ICD-10-CM | POA: Insufficient documentation

## 2020-09-12 LAB — COMPREHENSIVE METABOLIC PANEL
ALT: 12 IU/L (ref 0–32)
AST: 16 IU/L (ref 0–40)
Albumin/Globulin Ratio: 1.6 (ref 1.2–2.2)
Albumin: 4.8 g/dL (ref 3.8–4.9)
Alkaline Phosphatase: 129 IU/L — ABNORMAL HIGH (ref 44–121)
BUN/Creatinine Ratio: 20 (ref 9–23)
BUN: 15 mg/dL (ref 6–24)
Bilirubin Total: 0.8 mg/dL (ref 0.0–1.2)
CO2: 24 mmol/L (ref 20–29)
Calcium: 9.8 mg/dL (ref 8.7–10.2)
Chloride: 104 mmol/L (ref 96–106)
Creatinine, Ser: 0.75 mg/dL (ref 0.57–1.00)
GFR calc Af Amer: 102 mL/min/{1.73_m2} (ref >59)
GFR calc non Af Amer: 89 mL/min/{1.73_m2} (ref >59)
Globulin, Total: 3 g/dL (ref 1.5–4.5)
Glucose: 74 mg/dL (ref 65–99)
Potassium: 3.6 mmol/L (ref 3.5–5.2)
Sodium: 143 mmol/L (ref 134–144)
Total Protein: 7.8 g/dL (ref 6.0–8.5)

## 2020-09-12 NOTE — Addendum Note (Signed)
Addended by: Zettie Cooley E on: 09/12/2020 11:52 AM   Modules accepted: Orders

## 2020-09-12 NOTE — Assessment & Plan Note (Signed)
Discussed this case with Dr. Hartford Poli, Dr. Andria Frames, Dr. Nori Riis.  Plan for this patient  Referral to CCM, which patient is agreeable with.  Transportation help as she does not have a car at this time  Connection to therapy or counseling  We will not start any medications today but will consider in the future.   Recommended that patient follow-up with [place] closely while she obtains resources for therapy and counseling.  Provided information for therapy, counselors, suicide hotline

## 2020-09-12 NOTE — Chronic Care Management (AMB) (Signed)
  Care Management   Note  09/12/2020 Name: Myca Perno MRN: [id number] DOB: 28-Nov-1961  Jess Barters Rabelo is a 59 y.o. year old female who is a primary care patient of Danna Hefty, DO. I reached out to [employer] by phone today in response to a referral sent by Ms. Joann Renee Gottlieb's health plan.    Ms. Sample was given information about care management services today including:  1. Care management services include personalized support from designated clinical staff supervised by her physician, including individualized plan of care and coordination with other care providers 2. 24/7 contact phone numbers for assistance for urgent and routine care needs. 3. The patient may stop care management services at any time by phone call to the office staff.  Patient agreed to services and verbal consent obtained.   Follow up plan: Telephone appointment with care management team member scheduled for: 09/17/2020  [place] Management

## 2020-09-12 NOTE — Assessment & Plan Note (Signed)
Given this pain has been present since surgery in 2012, this is likely more so neuropathic pain having to do with this.  Have called patient's neurosurgeon office to schedule follow-up visit.  They will call her to make a visit.  We will also check CMP given patient's heavy Excedrin use.

## 2020-09-14 LAB — SPECIMEN STATUS REPORT

## 2020-09-14 LAB — GAMMA GT: GGT: 12 IU/L (ref 0–60)

## 2020-09-17 ENCOUNTER — Ambulatory Visit: Payer: Medicaid Other | Admitting: Licensed Clinical Social Worker

## 2020-09-17 DIAGNOSIS — F322 Major depressive disorder, single episode, severe without psychotic features: Secondary | ICD-10-CM

## 2020-09-17 NOTE — Patient Instructions (Signed)
  Cynthia Orozco  it was nice speaking with you. Please call me directly if you have questions about the goals we discussed. Goals Addressed            This Visit's Progress   . Begin and Stick with Counseling       Follow Up Date 09/24/2020    - check out counseling - keep 21 percent of counseling appointments - schedule counseling appointment complete [facility]  - implement clinical interventions discussed today to decreases symptoms of depression and stress and increase knowledge and/or ability of: stress reduction.  Why is this important?   Beating depression may take some time.  If you don't feel better right away, don't give up on your treatment plan.    Notes: Interventions:  . Assessed patient's previous treatment, needs, coping, current treatment, support and barriers to care . Provided basic mental health support, education and interventions ( Provided EMMI education on managing stress) . Discussed several options for long term counseling based on need and insurance. Assisted patient with narrowing the options down to [facility] . Collaborated with [facility] regarding appointment with patient on the phone . Other interventions include: Motivational Interviewing ;Mindfulness or Relaxation Training;Psychoeducation and/or Health Education    . Manage My Emotions       Follow Up Date 09/24/2020    - begin personal counseling - practice relaxation or meditation daily    Why is this important?   When you are stressed, down or upset, your body reacts too.  For example, your blood pressure may get higher; you may have a headache or stomachache.  When your emotions get the best of you, your body's ability to fight off cold and flu gets weak.  These steps will help you manage your emotions.     Notes:  Clinical Social Work Interventions: . Solution-Focused Strategies; Problem Solving; Behavioral Activation      Cynthia Orozco received Care  Management services today:  1. Care Management services include personalized support from designated clinical staff supervised by her physician, including individualized plan of care and coordination with other care providers 2. 24/7 contact [phone] for assistance for urgent and routine care needs. 3. Care Management are voluntary services and be declined at any time by calling the office.  Patient verbalizes understanding of instructions provided today.  Follow up plan: SW will follow up with patient by phone over the next 1 week  Maurine Cane, [place]

## 2020-09-17 NOTE — Chronic Care Management (AMB) (Signed)
Care Management   Clinical Social Work initial Note  09/17/2020 Name: Cynthia Orozco MRN: [id number] DOB: [birth year] Cynthia Orozco is a 59 y.o. year old female who sees Gilberts, [place], DO for primary care. The Care Management team was consulted by PCP to assist the patient with Mental Health Counseling and Resources and transportation options.  LCSW reached out to [employer] today by phone to introduce self, assess needs and barriers to care.    Assessment: Patient is experiencing symptom of depression which seems to be exacerbated by a trigger of often feeling rejected.  Parent reports this has been going on most of her life.  Wants to seek treatment now as he has noticed that her reactions are getting worse.  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        Recommendation: Patient may benefit from, and is in agreement to start counseling. Plan: LCSW will F/U with patient in one week  Review of patient status, including review of consultants reports, relevant laboratory and other test results, and collaboration with appropriate care team members and the patient's provider was performed as part of comprehensive patient evaluation and provision of chronic care management services.    Advance Directive Status:   Not addressed in this encounter SDOH (Social Determinants of Health) assessments performed: Yes:  SDOH Interventions     Most Recent Value  SDOH Interventions  SDOH Interventions for the Following Domains Depression  Depression Interventions/Treatment  Counseling      ; Goals Addressed            This Visit's Progress   . Begin and Stick with Counseling       Follow Up Date 09/24/2020    - check out counseling - keep 71 percent of counseling appointments - schedule counseling appointment [facility]  - implement clinical interventions discussed today to decreases symptoms of depression  and stress and increase knowledge and/or ability of: stress reduction.  Why is this important?   Beating depression may take some time.  If you don't feel better right away, don't give up on your treatment plan.    Notes: Interventions:  . Assessed patient's previous treatment, needs, coping, current treatment, support and barriers to care . Provided basic mental health support, education and interventions ( Provided EMMI education on managing stress) . Discussed several options for long term counseling based on need and insurance. Assisted patient with narrowing the options down to [facility] . Collaborated with [facility] regarding appointment with patient on the phone . Other interventions include: Motivational Interviewing ;Mindfulness or Relaxation Training;Psychoeducation and/or Health Education    . Manage My Emotions       Follow Up Date 09/24/2020    - begin personal counseling - practice relaxation or meditation daily    Why is this important?   When you are stressed, down or upset, your body reacts too.  For example, your blood pressure may get higher; you may have a headache or stomachache.  When your emotions get the best of you, your body's ability to fight off cold and flu gets weak.  These steps will help you manage your emotions.     Notes:  Clinical Social Work Interventions: . Solution-Focused Strategies; Problem Solving; Behavioral Activation       Outpatient Encounter Medications as of 09/17/2020  Medication Sig  . albuterol (VENTOLIN HFA) 108 (90 Base) MCG/ACT inhaler Inhale 2 puffs into the  lungs every 6 (six) hours as needed for wheezing or shortness of breath.  . cetirizine (ZYRTEC) 10 MG tablet Take 1 tablet (10 mg total) by mouth daily.  Marland Kitchen lidocaine (LIDODERM) 5 % Place 1 patch onto the skin daily. Remove & Discard patch within 12 hours or as directed by MD  . Multiple Vitamin (MULTIVITAMIN WITH MINERALS) TABS tablet Take 1 tablet by  mouth daily.  Marland Kitchen triamcinolone cream (KENALOG) 0.1 % Apply 1 application topically 2 (two) times daily as needed.  . valACYclovir (VALTREX) 500 MG tablet Take 1 tablet (500 mg total) by mouth 2 (two) times daily. Take as needed  . vitamin B-12 (CYANOCOBALAMIN) 100 MCG tablet Take 100 mcg by mouth daily.   No facility-administered encounter medications on file as of 09/17/2020.     Casimer Lanius, [place] / [place]   [phone] 11:46 AM

## 2020-09-24 ENCOUNTER — Telehealth: Payer: Medicaid Other

## 2020-09-24 ENCOUNTER — Encounter: Payer: Self-pay | Admitting: Family Medicine

## 2020-09-24 ENCOUNTER — Telehealth: Payer: Self-pay | Admitting: Licensed Clinical Social Worker

## 2020-09-24 NOTE — Chronic Care Management (AMB) (Signed)
    Clinical Social Work  Care Management Outreach   09/24/2020 Name: Rosmarie Esquibel MRN: [id number] DOB: [birth year]  Jess Barters Seal is a 59 y.o. year old female who is a primary care patient of Danna Hefty, DO .   F/U phone call to [employer] [place] today to assess needs, and progress with care plan goals.  Patient states unable to talk but will call LCSW back today Plan: LCSW will wait for return call; if no return call will reach out to patient in 3 to 5 days  Review of patient status, including review of consultants reports, relevant laboratory and other test results, and collaboration with appropriate care team members and the patient's provider was performed as part of comprehensive patient evaluation and provision of care management services.    Casimer Lanius, [place] / [place]   [phone] 2:17 PM

## 2020-10-04 ENCOUNTER — Ambulatory Visit: Payer: Medicaid Other | Admitting: Licensed Clinical Social Worker

## 2020-10-04 DIAGNOSIS — Z7189 Other specified counseling: Secondary | ICD-10-CM

## 2020-10-04 NOTE — Patient Instructions (Signed)
  Ms. Mersman  it was nice speaking with you. Please call me directly [phone] if you have questions about the goals we discussed. Goals Addressed            This Visit's Progress   . Begin and Stick with Counseling   Not on track     - check out counseling - keep 80 percent of counseling appointments - schedule counseling appointment [facility]  - implement clinical interventions discussed today to decreases symptoms of depression and stress and increase knowledge and/or ability of: stress reduction.  Why is this important?   Beating depression may take some time.  If you don't feel better right away, don't give up on your treatment plan.       . Manage My Emotions   On track      - begin personal counseling - practice relaxation or meditation daily    Why is this important?   When you are stressed, down or upset, your body reacts too.  For example, your blood pressure may get higher; you may have a headache or stomachache.  When your emotions get the best of you, your body's ability to fight off cold and flu gets weak.  These steps will help you manage your emotions.        Ms. Koehler received Care Management services today:  1. Care Management services include personalized support from designated clinical staff supervised by her physician, including individualized plan of care and coordination with other care providers 2. 24/7 contact [phone] for assistance for urgent and routine care needs. 3. Care Management are voluntary services and be declined at any time by calling the office.  Patient verbalizes understanding of instructions provided today.  Follow up plan: Client will call office or LCSW as needed. no follow up scheduled  Maurine Cane, LCSW

## 2020-10-04 NOTE — Chronic Care Management (AMB) (Signed)
   Clinical Social Work  Care Management Follow up   10/04/2020 Name: Cynthia Orozco MRN: [id number] DOB: 07-22-1961  Cynthia Orozco is a 59 y.o. year old female who is a primary care patient of Danna Hefty, DO . LCSW was consulted to assistance with Mental Health Counseling and Resources.  LCSW reached out to [employer] today by phone to assess needs and barriers.    Assessment: Patient has not connected with [facility] however reports she still has contact information to call. Intervention:  Assessment of needs and barriers completed;  Plan: Patient agreed to services provided today, however does not desire additional follow up. States she has what she needs and will contact LCSW if needed.  No f/u scheduled at this time  Advance Directive Status: not addressed during this encounter.  SDOH (Social Determinants of Health) assessments performed:; No needs identified   Goals Addressed            This Visit's Progress   . Begin and Stick with Counseling   Not on track     - check out counseling - keep 38 percent of counseling appointments - schedule counseling appointment [facility]  - implement clinical interventions discussed today to decreases symptoms of depression and stress and increase knowledge and/or ability of: stress reduction.  Why is this important?   Beating depression may take some time.  If you don't feel better right away, don't give up on your treatment plan.       . Manage My Emotions   On track      - begin personal counseling - practice relaxation or meditation daily    Why is this important?   When you are stressed, down or upset, your body reacts too.  For example, your blood pressure may get higher; you may have a headache or stomachache.  When your emotions get the best of you, your body's ability to fight off cold and flu gets weak.  These steps will help you manage your emotions.          Outpatient Encounter Medications as of 10/04/2020  Medication Sig  . albuterol (VENTOLIN HFA) 108 (90 Base) MCG/ACT inhaler Inhale 2 puffs into the lungs every 6 (six) hours as needed for wheezing or shortness of breath.  . cetirizine (ZYRTEC) 10 MG tablet Take 1 tablet (10 mg total) by mouth daily.  Marland Kitchen lidocaine (LIDODERM) 5 % Place 1 patch onto the skin daily. Remove & Discard patch within 12 hours or as directed by MD  . Multiple Vitamin (MULTIVITAMIN WITH MINERALS) TABS tablet Take 1 tablet by mouth daily.  Marland Kitchen triamcinolone cream (KENALOG) 0.1 % Apply 1 application topically 2 (two) times daily as needed.  . valACYclovir (VALTREX) 500 MG tablet Take 1 tablet (500 mg total) by mouth 2 (two) times daily. Take as needed  . vitamin B-12 (CYANOCOBALAMIN) 100 MCG tablet Take 100 mcg by mouth daily.   No facility-administered encounter medications on file as of 10/04/2020.   Review of patient status, including review of consultants reports, relevant laboratory and other test results, and collaboration with appropriate care team members and the patient's provider was performed as part of comprehensive patient evaluation and provision of care management services.   Casimer Lanius, [place] / [place]   [phone] 12:57 PM

## 2020-11-28 DIAGNOSIS — Z923 Personal history of irradiation: Secondary | ICD-10-CM | POA: Diagnosis not present

## 2020-11-28 DIAGNOSIS — D420 Neoplasm of uncertain behavior of cerebral meninges: Secondary | ICD-10-CM | POA: Diagnosis not present

## 2020-11-28 DIAGNOSIS — Z9889 Other specified postprocedural states: Secondary | ICD-10-CM | POA: Diagnosis not present

## 2021-01-09 ENCOUNTER — Telehealth: Payer: Self-pay

## 2021-01-09 NOTE — Telephone Encounter (Signed)
Patient calls nurse line regarding cut on finger. Patient reports that approx 3 weeks ago she had a puncture wound from broken piece of glass. States that she was able to remove all of the glass and cleaned the area after removal. However, patient states that area is still sore, red and swollen. Denies drainage or fever. Offered to schedule patient f/u appointment this week, however, patient needs to wait until next week due to transportation.   Scheduled patient for Monday afternoon with Dr. Tarry Kos.   Strict return/ ED precautions given.   Talbot Grumbling, RN

## 2021-01-12 NOTE — Progress Notes (Deleted)
   Subjective:   Patient ID: Cynthia Orozco    DOB: 13-Sep-1961, 60 y.o. female   MRN: [id number]  Cynthia Orozco is a 60 y.o. female with a history of benign neoplasm of meninges, chronic tension headaches, schwannoma of crania nerve, atopic dermatitis, HSV2, chronic congestion, depression w/ history of suicidal attempt, HLD, seasonal allergies, tobacco use  here for finger laceration  Finger Laceration: Patient noted to have cut her finger with glass ~3-4 weeks ago. She was able to remove all of the glass and cleaned the area but her finger has continued to remain sore, red, and swollen. Denies drainage or fever.  Today she notes ***  Review of Systems:  Per HPI.   Objective:   LMP  (LMP Unknown)  Vitals and nursing note reviewed.  General: pleasant ***, sitting comfortably in exam chair, well nourished, well developed, in no acute distress with non-toxic appearance HEENT: normocephalic, atraumatic, moist mucous membranes, oropharynx clear without erythema or exudate, TM normal bilaterally  Neck: supple, non-tender without lymphadenopathy CV: regular rate and rhythm without murmurs, rubs, or gallops, no lower extremity edema, 2+ radial and pedal pulses bilaterally Lungs: clear to auscultation bilaterally with normal work of breathing on room air Resp: breathing comfortably on room air, speaking in full sentences Abdomen: soft, non-tender, non-distended, no masses or organomegaly palpable, normoactive bowel sounds Skin: warm, dry, no rashes or lesions Extremities: warm and well perfused, normal tone MSK: ROM grossly intact, strength intact, gait normal Neuro: Alert and oriented, speech normal  Assessment & Plan:   No problem-specific Assessment & Plan notes found for this encounter.  No orders of the defined types were placed in this encounter.  No orders of the defined types were placed in this encounter.   Mina Marble, DO PGY-3, [facility] 01/12/2021 11:27 AM

## 2021-01-13 ENCOUNTER — Ambulatory Visit: Payer: Medicaid Other | Admitting: Family Medicine

## 2021-02-25 DIAGNOSIS — I10 Essential (primary) hypertension: Secondary | ICD-10-CM | POA: Diagnosis not present

## 2021-02-25 DIAGNOSIS — R0989 Other specified symptoms and signs involving the circulatory and respiratory systems: Secondary | ICD-10-CM | POA: Diagnosis not present

## 2021-02-25 DIAGNOSIS — R002 Palpitations: Secondary | ICD-10-CM | POA: Diagnosis not present

## 2021-05-19 ENCOUNTER — Other Ambulatory Visit: Payer: Self-pay

## 2021-05-19 DIAGNOSIS — H1010 Acute atopic conjunctivitis, unspecified eye: Secondary | ICD-10-CM

## 2021-05-19 DIAGNOSIS — J309 Allergic rhinitis, unspecified: Secondary | ICD-10-CM

## 2021-05-19 MED ORDER — VALACYCLOVIR HCL 500 MG PO TABS: 500.0000 mg | ORAL_TABLET | Freq: Two times a day (BID) | ORAL | 11 refills | Status: DC

## 2021-05-19 MED ORDER — ALBUTEROL SULFATE HFA 108 (90 BASE) MCG/ACT IN AERS: 2.0000 | INHALATION_SPRAY | Freq: Four times a day (QID) | RESPIRATORY_TRACT | 2 refills | Status: DC | PRN

## 2021-05-19 MED ORDER — CETIRIZINE HCL 10 MG PO TABS: 10.0000 mg | ORAL_TABLET | Freq: Every day | ORAL | 3 refills | Status: DC

## 2021-07-28 DIAGNOSIS — Z113 Encounter for screening for infections with a predominantly sexual mode of transmission: Secondary | ICD-10-CM | POA: Diagnosis not present

## 2021-12-23 ENCOUNTER — Other Ambulatory Visit: Payer: Self-pay

## 2021-12-23 ENCOUNTER — Ambulatory Visit (INDEPENDENT_AMBULATORY_CARE_PROVIDER_SITE_OTHER): Payer: Medicaid Other

## 2021-12-23 DIAGNOSIS — Z111 Encounter for screening for respiratory tuberculosis: Secondary | ICD-10-CM

## 2021-12-23 NOTE — Progress Notes (Signed)
Patient is here for a PPD placement.  PPD placed in left forearm @ 2:30 pm.  Patient will return 12/25/2021 to have PPD read.    Talbot Grumbling, RN

## 2021-12-25 ENCOUNTER — Ambulatory Visit (INDEPENDENT_AMBULATORY_CARE_PROVIDER_SITE_OTHER): Payer: Medicaid Other

## 2021-12-25 ENCOUNTER — Other Ambulatory Visit: Payer: Self-pay

## 2021-12-25 DIAGNOSIS — Z111 Encounter for screening for respiratory tuberculosis: Secondary | ICD-10-CM

## 2021-12-25 LAB — TB SKIN TEST
Induration: 0 mm
TB Skin Test: NEGATIVE

## 2021-12-25 NOTE — Progress Notes (Signed)
Patient is here for a PPD read.  It was placed on 12/23/2021 in the left forearm @ 2:30 pm.    PPD RESULTS:  Result: negative Induration: 0 mm  Letter created and given to patient for documentation purposes. Talbot Grumbling, RN

## 2021-12-29 ENCOUNTER — Ambulatory Visit: Payer: Medicaid Other | Admitting: Family Medicine
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
## 2021-12-31 ENCOUNTER — Other Ambulatory Visit: Payer: Self-pay

## 2021-12-31 ENCOUNTER — Ambulatory Visit (INDEPENDENT_AMBULATORY_CARE_PROVIDER_SITE_OTHER): Payer: Medicaid Other | Admitting: Family Medicine

## 2021-12-31 ENCOUNTER — Encounter: Payer: Self-pay | Admitting: Family Medicine

## 2021-12-31 ENCOUNTER — Other Ambulatory Visit (HOSPITAL_COMMUNITY)
Admission: RE | Admit: 2021-12-31 | Discharge: 2021-12-31 | Disposition: A | Discharge status: Home / Self Care | Payer: Medicaid Other | Source: Ambulatory Visit | Attending: Family Medicine | Admitting: Family Medicine

## 2021-12-31 VITALS — BP 128/60 | Ht 65.0 in | Wt 140.2 lb

## 2021-12-31 DIAGNOSIS — Z0000 Encounter for general adult medical examination without abnormal findings: Secondary | ICD-10-CM

## 2021-12-31 DIAGNOSIS — Z131 Encounter for screening for diabetes mellitus: Secondary | ICD-10-CM | POA: Diagnosis not present

## 2021-12-31 DIAGNOSIS — F99 Mental disorder, not otherwise specified: Secondary | ICD-10-CM | POA: Diagnosis not present

## 2021-12-31 DIAGNOSIS — N898 Other specified noninflammatory disorders of vagina: Secondary | ICD-10-CM | POA: Diagnosis not present

## 2021-12-31 DIAGNOSIS — Z113 Encounter for screening for infections with a predominantly sexual mode of transmission: Secondary | ICD-10-CM

## 2021-12-31 DIAGNOSIS — Z0001 Encounter for general adult medical examination with abnormal findings: Secondary | ICD-10-CM

## 2021-12-31 DIAGNOSIS — F419 Anxiety disorder, unspecified: Secondary | ICD-10-CM | POA: Diagnosis not present

## 2021-12-31 DIAGNOSIS — Z114 Encounter for screening for human immunodeficiency virus [HIV]: Secondary | ICD-10-CM | POA: Diagnosis not present

## 2021-12-31 DIAGNOSIS — F411 Generalized anxiety disorder: Secondary | ICD-10-CM | POA: Diagnosis not present

## 2021-12-31 DIAGNOSIS — Z13220 Encounter for screening for lipoid disorders: Secondary | ICD-10-CM

## 2021-12-31 DIAGNOSIS — F5105 Insomnia due to other mental disorder: Secondary | ICD-10-CM

## 2021-12-31 DIAGNOSIS — G4700 Insomnia, unspecified: Secondary | ICD-10-CM | POA: Insufficient documentation

## 2021-12-31 DIAGNOSIS — Z1231 Encounter for screening mammogram for malignant neoplasm of breast: Secondary | ICD-10-CM

## 2021-12-31 DIAGNOSIS — F5101 Primary insomnia: Secondary | ICD-10-CM | POA: Diagnosis not present

## 2021-12-31 LAB — POCT WET PREP (WET MOUNT)
Clue Cells Wet Prep Whiff POC: NEGATIVE
Trichomonas Wet Prep HPF POC: ABSENT
WBC, Wet Prep HPF POC: >20

## 2021-12-31 LAB — POCT GLYCOSYLATED HEMOGLOBIN (HGB A1C): Hemoglobin A1C: 5.3 % (ref 4.0–5.6)

## 2021-12-31 MED ORDER — TRAZODONE HCL 50 MG PO TABS: 50.0000 mg | ORAL_TABLET | Freq: Every day | ORAL | 1 refills | Status: DC

## 2021-12-31 NOTE — Assessment & Plan Note (Signed)
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  Discussed sleep hygiene, and further treatment options including therapy and SSRIs.  Patient would like to trial trazodone.  We will start with trazodone 50 mg nightly, titrate.  Follow-up in 2 to 3 weeks.

## 2021-12-31 NOTE — Patient Instructions (Addendum)
It was a pleasure to see you today!  We will get some labs today.  If they are abnormal or we need to do something about them, I will call you.  If they are normal, I will send you a message on MyChart (if it is active) or a letter in the mail.  If you don't hear from [place] in 2 weeks, please call the office  [phone]. For sleep: I recommend trying to turn on a soft light, gentle sleep story, avoid screens (phone, tablet, computer, tv), and read a book or find something to do with your hands. Also try trazodone, start with 50 mg. If no improvement after 1-2 nights, try 100 mg (2 pills). If no improvement, follow up or try 150 mg (3 pills). Follow up in 2-4 weeks Make an appointment for your mammogram   Be Well,  Dr. Chauncey Reading
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
## 2022-01-01 LAB — COMPREHENSIVE METABOLIC PANEL
ALT: 11 IU/L (ref 0–32)
AST: 20 IU/L (ref 0–40)
Albumin/Globulin Ratio: 1.5 (ref 1.2–2.2)
Albumin: 4.8 g/dL (ref 3.8–4.9)
Alkaline Phosphatase: 110 IU/L (ref 44–121)
BUN/Creatinine Ratio: 23 (ref 9–23)
BUN: 18 mg/dL (ref 6–24)
Bilirubin Total: 1.1 mg/dL (ref 0.0–1.2)
CO2: 22 mmol/L (ref 20–29)
Calcium: 10.1 mg/dL (ref 8.7–10.2)
Chloride: 103 mmol/L (ref 96–106)
Creatinine, Ser: 0.8 mg/dL (ref 0.57–1.00)
Globulin, Total: 3.3 g/dL (ref 1.5–4.5)
Glucose: 77 mg/dL (ref 70–99)
Potassium: 3.5 mmol/L (ref 3.5–5.2)
Sodium: 142 mmol/L (ref 134–144)
Total Protein: 8.1 g/dL (ref 6.0–8.5)
eGFR: 85 mL/min/{1.73_m2} (ref >59)

## 2022-01-01 LAB — CERVICOVAGINAL ANCILLARY ONLY
Chlamydia: NEGATIVE
Comment: NEGATIVE
Comment: NORMAL
Neisseria Gonorrhea: NEGATIVE

## 2022-01-01 LAB — LIPID PANEL
Chol/HDL Ratio: 3.4 ratio (ref 0.0–4.4)
Cholesterol, Total: 253 mg/dL — ABNORMAL HIGH (ref 100–199)
HDL: 75 mg/dL (ref >39)
LDL Chol Calc (NIH): 157 mg/dL — ABNORMAL HIGH (ref 0–99)
Triglycerides: 118 mg/dL (ref 0–149)
VLDL Cholesterol Cal: 21 mg/dL (ref 5–40)

## 2022-01-01 LAB — RPR: RPR Ser Ql: NONREACTIVE

## 2022-01-01 LAB — HIV ANTIBODY (ROUTINE TESTING W REFLEX): HIV Screen 4th Generation wRfx: NONREACTIVE

## 2022-01-02 ENCOUNTER — Encounter: Payer: Self-pay | Admitting: Family Medicine

## 2022-01-02 NOTE — Progress Notes (Signed)
Letter with results sent.  Gladys Damme, MD [place] [facility], PGY-3

## 2022-01-09 ENCOUNTER — Ambulatory Visit: Payer: Medicaid Other

## 2022-01-29 NOTE — Progress Notes (Unsigned)
° ° °  SUBJECTIVE:   CHIEF COMPLAINT / HPI:   Insomnia: Patient has been using trazodone for sleep, she reports ***. GAD 7 score today is ***, PHQ-9 ***.  PERTINENT  PMH / PSH: Insomnia, smoking, shwannoma of cranial nerve  OBJECTIVE:   LMP  (LMP Unknown)   Nursing note and vitals reviewed GEN: *** resting comfortably in chair, NAD, WNWD HEENT: NCAT. PERRLA. Sclera without injection or icterus. MMM.  Neck: Supple.  Cardiac: Regular rate and rhythm. Normal S1/S2. No murmurs, rubs, or gallops appreciated. 2+ radial pulses. Lungs: Clear bilaterally to ascultation. No increased WOB, no accessory muscle usage. No w/r/r. Abdomen: Normoactive bowel sounds. No tenderness to deep or light palpation. No rebound or guarding.  ***  Neuro: AOx3 *** Ext: no edema Psych: Pleasant and appropriate    ASSESSMENT/PLAN:   No problem-specific Assessment & Plan notes found for this encounter.     Gladys Damme, MD [place]

## 2022-01-30 ENCOUNTER — Ambulatory Visit: Payer: Medicaid Other | Admitting: Family Medicine

## 2022-01-30 ENCOUNTER — Telehealth: Payer: Self-pay | Admitting: Family Medicine

## 2022-01-30 NOTE — Telephone Encounter (Signed)
Patient had missed appointment. Called cell phone, no answer. Left HIPAA compliant VM. If patient calls back, please make her aware of no show policy. I will send letter.  Gladys Damme, MD [place] [facility], PGY-3

## 2022-05-19 ENCOUNTER — Encounter: Payer: Self-pay | Admitting: *Deleted

## 2022-06-09 ENCOUNTER — Other Ambulatory Visit: Payer: Self-pay

## 2022-06-09 DIAGNOSIS — H1010 Acute atopic conjunctivitis, unspecified eye: Secondary | ICD-10-CM

## 2022-06-10 MED ORDER — CETIRIZINE HCL 10 MG PO TABS: 10.0000 mg | ORAL_TABLET | Freq: Every day | ORAL | 3 refills | Status: DC

## 2022-08-24 ENCOUNTER — Telehealth: Payer: Self-pay | Admitting: Family Medicine

## 2022-08-24 NOTE — Telephone Encounter (Signed)
Patient dropped off medical report form to be completed. Last DOS was 12/31/21. Placed in [employer].

## 2022-08-24 NOTE — Telephone Encounter (Signed)
Placed a form in your box for the pt  for employment, if you would fill it out I'll greatly appreciate it. Thank you!

## 2022-08-26 NOTE — Telephone Encounter (Signed)
Form completed and placed in RN triage box.

## 2022-08-27 NOTE — Telephone Encounter (Signed)
Patient called and informed that forms are ready for pick up. Copy made and placed in batch scanning. Original placed at front desk for pick up.   Maryjayne Kleven C Mindy Behnken, RN  

## 2022-12-02 ENCOUNTER — Other Ambulatory Visit: Payer: Self-pay

## 2022-12-25 DIAGNOSIS — A749 Chlamydial infection, unspecified: Secondary | ICD-10-CM

## 2023-01-11 ENCOUNTER — Encounter: Payer: Self-pay | Admitting: Family Medicine

## 2023-01-11 ENCOUNTER — Other Ambulatory Visit (HOSPITAL_COMMUNITY)
Admission: RE | Admit: 2023-01-11 | Discharge: 2023-01-11 | Disposition: A | Discharge status: Home / Self Care | Payer: Medicaid Other | Source: Ambulatory Visit | Attending: Family Medicine | Admitting: Family Medicine

## 2023-01-11 ENCOUNTER — Ambulatory Visit (INDEPENDENT_AMBULATORY_CARE_PROVIDER_SITE_OTHER): Payer: Medicaid Other | Admitting: Family Medicine

## 2023-01-11 VITALS — BP 183/100 | HR 73 | Ht 65.0 in | Wt 136.4 lb

## 2023-01-11 DIAGNOSIS — Z113 Encounter for screening for infections with a predominantly sexual mode of transmission: Secondary | ICD-10-CM

## 2023-01-11 DIAGNOSIS — Z0001 Encounter for general adult medical examination with abnormal findings: Secondary | ICD-10-CM | POA: Insufficient documentation

## 2023-01-11 DIAGNOSIS — J309 Allergic rhinitis, unspecified: Secondary | ICD-10-CM

## 2023-01-11 DIAGNOSIS — H1010 Acute atopic conjunctivitis, unspecified eye: Secondary | ICD-10-CM

## 2023-01-11 DIAGNOSIS — I10 Essential (primary) hypertension: Secondary | ICD-10-CM | POA: Diagnosis not present

## 2023-01-11 DIAGNOSIS — Z1211 Encounter for screening for malignant neoplasm of colon: Secondary | ICD-10-CM | POA: Diagnosis not present

## 2023-01-11 DIAGNOSIS — Z1231 Encounter for screening mammogram for malignant neoplasm of breast: Secondary | ICD-10-CM | POA: Diagnosis not present

## 2023-01-11 DIAGNOSIS — A6000 Herpesviral infection of urogenital system, unspecified: Secondary | ICD-10-CM

## 2023-01-11 LAB — POCT WET PREP (WET MOUNT)
Clue Cells Wet Prep Whiff POC: NEGATIVE
Trichomonas Wet Prep HPF POC: ABSENT

## 2023-01-11 MED ORDER — ADULT MULTIVITAMIN W/MINERALS CH: 1.0000 | ORAL_TABLET | Freq: Every day | ORAL | Status: AC

## 2023-01-11 MED ORDER — CETIRIZINE HCL 10 MG PO TABS: 10.0000 mg | ORAL_TABLET | Freq: Every day | ORAL | 3 refills | Status: DC

## 2023-01-11 MED ORDER — ALBUTEROL SULFATE HFA 108 (90 BASE) MCG/ACT IN AERS: 2.0000 | INHALATION_SPRAY | Freq: Four times a day (QID) | RESPIRATORY_TRACT | 2 refills | Status: AC | PRN

## 2023-01-11 MED ORDER — VALACYCLOVIR HCL 500 MG PO TABS: 500.0000 mg | ORAL_TABLET | Freq: Two times a day (BID) | ORAL | 1 refills | Status: DC

## 2023-01-11 NOTE — Assessment & Plan Note (Signed)
BP elevated today in clinic. History of HTN. Does not have blood pressure cuff at home. Asymptomatic, vitals stable. [facility] and follow up in 2 weeks. Consider restarting HCTZ at this time.

## 2023-01-11 NOTE — Assessment & Plan Note (Signed)
Discussed relevant health maintenance below. Pap performed today.

## 2023-01-11 NOTE — Patient Instructions (Signed)
It was great to see you! Thank you for allowing me to participate in your care!  I recommend that you always bring your medications to each appointment as this makes it easy to ensure we are on the correct medications and helps [place] not miss when refills are needed.  Our plans for today:  -I have ordered a mammogram for you, as well as colonoscopy, lab work, and sexually transmitted infection screening.  I will call you to let you know if there are any abnormal results. -I would like you to follow-up in 2 weeks to talk about your blood pressure.  We are checking some labs today, I will call you if they are abnormal will send you a MyChart message or a letter if they are normal.  If you do not hear about your labs in the next 2 weeks please let us know.  Please arrive 15 minutes PRIOR to your next scheduled appointment time! If you do not, this affects OTHER patients' care.  Take care and seek immediate care sooner if you develop any concerns.   Dr. Salvadore Oxford, MD [place]

## 2023-01-11 NOTE — Assessment & Plan Note (Signed)
Patient has had multiple sexual partners over past several months. Denies having current symptoms but would like to be tested STIs. Testing as below.
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
## 2023-01-12 LAB — BASIC METABOLIC PANEL
BUN/Creatinine Ratio: 19 (ref 9–23)
BUN: 15 mg/dL (ref 6–24)
CO2: 26 mmol/L (ref 20–29)
Calcium: 9.9 mg/dL (ref 8.7–10.2)
Chloride: 104 mmol/L (ref 96–106)
Creatinine, Ser: 0.77 mg/dL (ref 0.57–1.00)
Glucose: 73 mg/dL (ref 70–99)
Potassium: 3.2 mmol/L — ABNORMAL LOW (ref 3.5–5.2)
Sodium: 144 mmol/L (ref 134–144)
eGFR: 89 mL/min/{1.73_m2} (ref >59)

## 2023-01-12 LAB — CBC
Hematocrit: 41.3 % (ref 34.0–46.6)
Hemoglobin: 13.9 g/dL (ref 11.1–15.9)
MCH: 29.8 pg (ref 26.6–33.0)
MCHC: 33.7 g/dL (ref 31.5–35.7)
MCV: 88 fL (ref 79–97)
Platelets: 312 10*3/uL (ref 150–450)
RBC: 4.67 x10E6/uL (ref 3.77–5.28)
RDW: 12.4 % (ref 11.7–15.4)
WBC: 4.8 10*3/uL (ref 3.4–10.8)

## 2023-01-12 LAB — LIPID PANEL
Chol/HDL Ratio: 3.4 ratio (ref 0.0–4.4)
Cholesterol, Total: 258 mg/dL — ABNORMAL HIGH (ref 100–199)
HDL: 76 mg/dL (ref >39)
LDL Chol Calc (NIH): 157 mg/dL — ABNORMAL HIGH (ref 0–99)
Triglycerides: 143 mg/dL (ref 0–149)
VLDL Cholesterol Cal: 25 mg/dL (ref 5–40)

## 2023-01-12 LAB — RPR: RPR Ser Ql: NONREACTIVE

## 2023-01-12 LAB — HIV ANTIBODY (ROUTINE TESTING W REFLEX): HIV Screen 4th Generation wRfx: NONREACTIVE

## 2023-01-12 LAB — HEMOGLOBIN A1C
Est. average glucose Bld gHb Est-mCnc: 105 mg/dL
Hgb A1c MFr Bld: 5.3 % (ref 4.8–5.6)

## 2023-01-14 NOTE — Progress Notes (Signed)
Called patient regarding lab results. Notable for elevated total cholesterol and LDL. Discussed going over benefits of statin at next visit on 2/13. Potassium low at 3.2. Scheduled patient for lab recheck. Patient denies chest pain, palpitations, dizziness. Encouraged potassium rich food.

## 2023-01-15 ENCOUNTER — Other Ambulatory Visit: Payer: Self-pay | Admitting: Family Medicine

## 2023-01-15 DIAGNOSIS — E876 Hypokalemia: Secondary | ICD-10-CM

## 2023-01-18 ENCOUNTER — Other Ambulatory Visit: Payer: Medicaid Other

## 2023-01-18 DIAGNOSIS — E876 Hypokalemia: Secondary | ICD-10-CM

## 2023-01-19 LAB — CYTOLOGY - PAP
Chlamydia: POSITIVE — AB
Comment: NEGATIVE
Comment: NEGATIVE
Comment: NEGATIVE
Comment: NORMAL
Diagnosis: UNDETERMINED — AB
High risk HPV: NEGATIVE
Neisseria Gonorrhea: NEGATIVE
Trichomonas: NEGATIVE

## 2023-01-19 LAB — BASIC METABOLIC PANEL
BUN/Creatinine Ratio: 17 (ref 9–23)
BUN: 15 mg/dL (ref 6–24)
CO2: 24 mmol/L (ref 20–29)
Calcium: 10.1 mg/dL (ref 8.7–10.2)
Chloride: 103 mmol/L (ref 96–106)
Creatinine, Ser: 0.87 mg/dL (ref 0.57–1.00)
Glucose: 81 mg/dL (ref 70–99)
Potassium: 4.5 mmol/L (ref 3.5–5.2)
Sodium: 144 mmol/L (ref 134–144)
eGFR: 77 mL/min/{1.73_m2} (ref >59)

## 2023-01-20 MED ORDER — DOXYCYCLINE MONOHYDRATE 100 MG PO TABS: 100.0000 mg | ORAL_TABLET | Freq: Two times a day (BID) | ORAL | 0 refills | Status: AC

## 2023-01-20 NOTE — Telephone Encounter (Signed)
Called patient to discuss positive chlamydia results as well as pap smear results. Discussed that per guidelines next pap smear due in 3 years per guidelines. Discussed treatment plan with patient with 7 day course of doxycycline. Patient verbalized understanding. Instructed to inform sexual partners that they should be tested as well. Discussed that potassium recheck was normal.

## 2023-01-21 ENCOUNTER — Telehealth: Payer: Self-pay

## 2023-01-21 NOTE — Telephone Encounter (Signed)
-----   Message from [place] Pink, [place] sent at 01/20/2023  9:48 AM EST ----- Regarding: STI reporting Positive chlamydia

## 2023-01-21 NOTE — Telephone Encounter (Signed)
STI form was faxed to [facility]. Salvatore Marvel, CMA

## 2023-01-25 NOTE — Progress Notes (Deleted)
    SUBJECTIVE:   CHIEF COMPLAINT / HPI: BP follow-up  ***  PERTINENT  PMH / PSH: HTN,   OBJECTIVE:   LMP  (LMP Unknown)   ***  ASSESSMENT/PLAN:   There are no diagnoses linked to this encounter. No follow-ups on file.  Salvadore Oxford, MD [place]

## 2023-01-26 ENCOUNTER — Ambulatory Visit: Payer: Medicaid Other | Admitting: Family Medicine

## 2023-03-02 ENCOUNTER — Ambulatory Visit
Admission: RE | Admit: 2023-03-02 | Discharge: 2023-03-02 | Disposition: A | Discharge status: Home / Self Care | Payer: Medicaid Other | Source: Ambulatory Visit | Attending: Family Medicine | Admitting: Family Medicine

## 2023-03-02 DIAGNOSIS — Z1231 Encounter for screening mammogram for malignant neoplasm of breast: Secondary | ICD-10-CM

## 2023-03-04 ENCOUNTER — Other Ambulatory Visit: Payer: Self-pay | Admitting: Family Medicine

## 2023-03-04 DIAGNOSIS — R928 Other abnormal and inconclusive findings on diagnostic imaging of breast: Secondary | ICD-10-CM

## 2023-03-24 ENCOUNTER — Ambulatory Visit
Admission: RE | Admit: 2023-03-24 | Discharge: 2023-03-24 | Disposition: A | Discharge status: Home / Self Care | Payer: Medicaid Other | Source: Ambulatory Visit | Attending: Family Medicine | Admitting: Family Medicine

## 2023-03-24 ENCOUNTER — Encounter: Payer: Self-pay | Admitting: Family Medicine

## 2023-03-24 ENCOUNTER — Other Ambulatory Visit: Payer: Self-pay | Admitting: Family Medicine

## 2023-03-24 DIAGNOSIS — R928 Other abnormal and inconclusive findings on diagnostic imaging of breast: Secondary | ICD-10-CM

## 2023-03-24 DIAGNOSIS — N6320 Unspecified lump in the left breast, unspecified quadrant: Secondary | ICD-10-CM

## 2023-04-01 ENCOUNTER — Ambulatory Visit
Admission: RE | Admit: 2023-04-01 | Discharge: 2023-04-01 | Disposition: A | Discharge status: Home / Self Care | Payer: Medicaid Other | Source: Ambulatory Visit | Attending: Family Medicine | Admitting: Family Medicine

## 2023-04-01 DIAGNOSIS — R928 Other abnormal and inconclusive findings on diagnostic imaging of breast: Secondary | ICD-10-CM

## 2023-04-01 DIAGNOSIS — N6320 Unspecified lump in the left breast, unspecified quadrant: Secondary | ICD-10-CM

## 2023-04-01 HISTORY — PX: BREAST BIOPSY: SHX20

## 2023-04-13 ENCOUNTER — Encounter: Payer: Self-pay | Admitting: Family Medicine

## 2023-04-13 DIAGNOSIS — N6089 Other benign mammary dysplasias of unspecified breast: Secondary | ICD-10-CM | POA: Insufficient documentation

## 2023-08-11 ENCOUNTER — Telehealth: Payer: Self-pay | Admitting: Family Medicine

## 2023-08-11 NOTE — Telephone Encounter (Signed)
Patient is dropping off form to be completed for employer. LDOS 01/11/23.  Patient would like for someone to call her when form is completed and ready to be picked up at the front desk. The best call back number is [phone]  I have placed form in blue team folder.

## 2023-08-13 NOTE — Telephone Encounter (Signed)
TB form placed back in [facility]. Patient was called to come in for another TB screening due to last one being done on 12/23/21. LVM for patient to call office to make that appt for either lab draw or nurse visit. Aquilla Solian, CMA

## 2023-08-17 ENCOUNTER — Ambulatory Visit: Payer: Medicaid Other

## 2023-08-18 ENCOUNTER — Ambulatory Visit (INDEPENDENT_AMBULATORY_CARE_PROVIDER_SITE_OTHER): Payer: Self-pay

## 2023-08-18 DIAGNOSIS — Z111 Encounter for screening for respiratory tuberculosis: Secondary | ICD-10-CM

## 2023-08-18 NOTE — Progress Notes (Signed)
Patient presents in nurse clinic for PPD test. PPD placed in left arm.  Patient to return on 9/6 to have site read.

## 2023-08-20 ENCOUNTER — Ambulatory Visit: Payer: Self-pay

## 2023-08-20 DIAGNOSIS — Z111 Encounter for screening for respiratory tuberculosis: Secondary | ICD-10-CM

## 2023-08-20 LAB — TB SKIN TEST
Induration: 0 mm
TB Skin Test: NEGATIVE

## 2023-08-20 NOTE — Progress Notes (Signed)
PPD Reading Note PPD read and results entered in EpicCare. Result: 0 mm induration. Interpretation: Negative Allergic reaction: No  

## 2023-09-10 ENCOUNTER — Ambulatory Visit: Payer: Self-pay | Admitting: Student

## 2023-09-13 ENCOUNTER — Ambulatory Visit: Payer: Self-pay

## 2023-09-21 ENCOUNTER — Ambulatory Visit: Payer: Self-pay

## 2023-10-01 ENCOUNTER — Other Ambulatory Visit (HOSPITAL_COMMUNITY): Payer: Self-pay

## 2023-10-01 ENCOUNTER — Emergency Department (HOSPITAL_COMMUNITY)
Admission: EM | Admit: 2023-10-01 | Discharge: 2023-10-01 | Disposition: A | Discharge status: Home / Self Care | Payer: 59 | Attending: Emergency Medicine | Admitting: Emergency Medicine

## 2023-10-01 ENCOUNTER — Encounter (HOSPITAL_COMMUNITY): Payer: Self-pay

## 2023-10-01 ENCOUNTER — Other Ambulatory Visit: Payer: Self-pay

## 2023-10-01 DIAGNOSIS — Z91040 Latex allergy status: Secondary | ICD-10-CM | POA: Insufficient documentation

## 2023-10-01 DIAGNOSIS — L040 Acute lymphadenitis of face, head and neck: Secondary | ICD-10-CM | POA: Insufficient documentation

## 2023-10-01 DIAGNOSIS — M542 Cervicalgia: Secondary | ICD-10-CM | POA: Diagnosis present

## 2023-10-01 DIAGNOSIS — R590 Localized enlarged lymph nodes: Secondary | ICD-10-CM

## 2023-10-01 DIAGNOSIS — Z1152 Encounter for screening for COVID-19: Secondary | ICD-10-CM | POA: Diagnosis not present

## 2023-10-01 LAB — CBC WITH DIFFERENTIAL/PLATELET
Abs Immature Granulocytes: 0 10*3/uL (ref 0.00–0.07)
Basophils Absolute: 0 10*3/uL (ref 0.0–0.1)
Basophils Relative: 1 %
Eosinophils Absolute: 0.2 10*3/uL (ref 0.0–0.5)
Eosinophils Relative: 3 %
HCT: 40.4 % (ref 36.0–46.0)
Hemoglobin: 13.3 g/dL (ref 12.0–15.0)
Immature Granulocytes: 0 %
Lymphocytes Relative: 50 %
Lymphs Abs: 2.5 10*3/uL (ref 0.7–4.0)
MCH: 30.2 pg (ref 26.0–34.0)
MCHC: 32.9 g/dL (ref 30.0–36.0)
MCV: 91.6 fL (ref 80.0–100.0)
Monocytes Absolute: 0.3 10*3/uL (ref 0.1–1.0)
Monocytes Relative: 6 %
Neutro Abs: 2 10*3/uL (ref 1.7–7.7)
Neutrophils Relative %: 40 %
Platelets: 288 10*3/uL (ref 150–400)
RBC: 4.41 MIL/uL (ref 3.87–5.11)
RDW: 12.9 % (ref 11.5–15.5)
WBC: 4.9 10*3/uL (ref 4.0–10.5)
nRBC: 0 % (ref 0.0–0.2)

## 2023-10-01 LAB — COMPREHENSIVE METABOLIC PANEL
ALT: 18 U/L (ref 0–44)
AST: 22 U/L (ref 15–41)
Albumin: 4.5 g/dL (ref 3.5–5.0)
Alkaline Phosphatase: 90 U/L (ref 38–126)
Anion gap: 10 (ref 5–15)
BUN: 19 mg/dL (ref 6–20)
CO2: 25 mmol/L (ref 22–32)
Calcium: 9.5 mg/dL (ref 8.9–10.3)
Chloride: 104 mmol/L (ref 98–111)
Creatinine, Ser: 0.67 mg/dL (ref 0.44–1.00)
GFR, Estimated: >60 mL/min (ref >60)
Glucose, Bld: 87 mg/dL (ref 70–99)
Potassium: 3.3 mmol/L — ABNORMAL LOW (ref 3.5–5.1)
Sodium: 139 mmol/L (ref 135–145)
Total Bilirubin: 1.3 mg/dL — ABNORMAL HIGH (ref 0.3–1.2)
Total Protein: 7.9 g/dL (ref 6.5–8.1)

## 2023-10-01 LAB — URINALYSIS, W/ REFLEX TO CULTURE (INFECTION SUSPECTED)
Bilirubin Urine: NEGATIVE
Glucose, UA: NEGATIVE mg/dL
Hgb urine dipstick: NEGATIVE
Ketones, ur: NEGATIVE mg/dL
Nitrite: NEGATIVE
Protein, ur: NEGATIVE mg/dL
Specific Gravity, Urine: 1.025 (ref 1.005–1.030)
WBC, UA: >50 WBC/hpf (ref 0–5)
pH: 5 (ref 5.0–8.0)

## 2023-10-01 LAB — GROUP A STREP BY PCR: Group A Strep by PCR: NOT DETECTED

## 2023-10-01 LAB — RESP PANEL BY RT-PCR (RSV, FLU A&B, COVID)  RVPGX2
Influenza A by PCR: NEGATIVE
Influenza B by PCR: NEGATIVE
Resp Syncytial Virus by PCR: NEGATIVE
SARS Coronavirus 2 by RT PCR: NEGATIVE

## 2023-10-01 MED ORDER — AMOXICILLIN-POT CLAVULANATE 875-125 MG PO TABS
1.0000 | ORAL_TABLET | Freq: Once | ORAL | Status: AC
  Administered 2023-10-01: 1 via ORAL
  Filled 2023-10-01: qty 1

## 2023-10-01 MED ORDER — AMOXICILLIN-POT CLAVULANATE 875-125 MG PO TABS
1.0000 | ORAL_TABLET | Freq: Two times a day (BID) | ORAL | 0 refills | Status: AC
  Filled 2023-10-01: qty 19, 10d supply, fill #0

## 2023-10-01 NOTE — ED Triage Notes (Signed)
Pt presenting today complaining of left sided throat pain/swelling. Pt believes it could be a gland, or lymph node. Pt states that it has been intermittent for a couple of weeks. Has been taking OTC pain meds with little relief. Endorses pain with swallowing, no difficulty breathing.

## 2023-10-01 NOTE — ED Provider Notes (Signed)
[place] EMERGENCY DEPARTMENT AT [facility] Provider Note   CSN: [id number] Arrival date & time: 10/01/23  1738     History  Chief Complaint  Patient presents with   Sore Throat    Cynthia Orozco is a 62 y.o. female, no pertinent past medical history, who presents to the ED secondary to left neck swelling and tenderness that began about couple weeks ago.  She states it is intermittently become more swollen, and then will go away, and notes she has not been having any fevers, chills, difficulty breathing.  States she feels it more when she does swallow.  But denies any sore throat, or cough.  Denies any weight loss, night sweats, neck pain.    Home Medications Prior to Admission medications   Medication Sig Start Date End Date Taking? Authorizing Provider  amoxicillin-clavulanate (AUGMENTIN) 875-125 MG tablet Take 1 tablet by mouth every 12 (twelve) hours. 10/01/23  Yes Latwan Luchsinger L, PA  albuterol (VENTOLIN HFA) 108 (90 Base) MCG/ACT inhaler Inhale 2 puffs into the lungs every 6 (six) hours as needed for wheezing or shortness of breath. 01/11/23   Celine Mans, MD  cetirizine (ZYRTEC) 10 MG tablet Take 1 tablet (10 mg total) by mouth daily. 01/11/23   Celine Mans, MD  lidocaine (LIDODERM) 5 % Place 1 patch onto the skin daily. Remove & Discard patch within 12 hours or as directed by MD 08/12/20   Orpah Cobb P, DO  Multiple Vitamin (MULTIVITAMIN WITH MINERALS) TABS tablet Take 1 tablet by mouth daily. 01/11/23   Celine Mans, MD  triamcinolone cream (KENALOG) 0.1 % Apply 1 application topically 2 (two) times daily as needed. 06/12/20   Mullis, Kiersten P, DO  valACYclovir (VALTREX) 500 MG tablet Take 1 tablet (500 mg total) by mouth 2 (two) times daily. Take as needed 01/11/23   Celine Mans, MD  vitamin B-12 (CYANOCOBALAMIN) 100 MCG tablet Take 100 mcg by mouth daily.    [provider]      Allergies    Apple juice, Grape extract (vitis  vinifera) [vitis viniferae], Pineapple, Strawberry extract, Other, and Latex    Review of Systems   Review of Systems  HENT:  Negative for sore throat.   Hematological:  Positive for adenopathy.    Physical Exam Updated Vital Signs BP (!) 163/111   Pulse 78   Temp 98 F (36.7 C)   Resp 18   Ht 5\' 5"  (1.651 m)   Wt 61.2 kg   LMP  (LMP Unknown)   SpO2 98%   BMI 22.47 kg/m  Physical Exam Vitals and nursing note reviewed.  Constitutional:      General: She is not in acute distress.    Appearance: She is well-developed.  HENT:     Head: Normocephalic and atraumatic.     Nose: No congestion.     Mouth/Throat:     Mouth: Mucous membranes are moist.     Pharynx: No pharyngeal swelling, oropharyngeal exudate or uvula swelling.     Tonsils: No tonsillar exudate or tonsillar abscesses.     Comments: Multiple caries particularly on the left lower teeth Eyes:     Conjunctiva/sclera: Conjunctivae normal.  Neck:     Comments: Left submandibular lymphadenopathy. No evidence of erythema, warmth of fluctuance.  Cardiovascular:     Rate and Rhythm: Normal rate and regular rhythm.     Heart sounds: No murmur heard. Pulmonary:     Effort: Pulmonary effort is normal. No respiratory distress.  Breath sounds: Normal breath sounds.  Abdominal:     Palpations: Abdomen is soft.     Tenderness: There is no abdominal tenderness.  Musculoskeletal:        General: No swelling.     Cervical back: Neck supple.  Skin:    General: Skin is warm and dry.     Capillary Refill: Capillary refill takes less than 2 seconds.  Neurological:     Mental Status: She is alert.  Psychiatric:        Mood and Affect: Mood normal.     ED Results / Procedures / Treatments   Labs (all labs ordered are listed, but only abnormal results are displayed) Labs Reviewed  COMPREHENSIVE METABOLIC PANEL - Abnormal; Notable for the following components:      Result Value   Potassium 3.3 (*)    Total Bilirubin  1.3 (*)    All other components within normal limits  GROUP A STREP BY PCR  RESP PANEL BY RT-PCR (RSV, FLU A&B, COVID)  [id number]  CBC WITH DIFFERENTIAL/PLATELET  URINALYSIS, W/ REFLEX TO CULTURE (INFECTION SUSPECTED)    EKG None  Radiology No results found.  Procedures Procedures    Medications Ordered in ED Medications  amoxicillin-clavulanate (AUGMENTIN) 875-125 MG per tablet 1 tablet (1 tablet Oral Given 10/01/23 2118)    ED Course/ Medical Decision Making/ A&P                                 Medical Decision Making Patient is a 62 year old female, who has had a large lymph node for the last 2 weeks, it comes and goes.  She has a left submandibular lymphadenopathy, with no overlying erythema or fluctuance.  She has no uvular deviation, and mouth is clear except for multiple caries on the left lower aspect of her mouth, and upper aspect.  We will obtain labs including COVID, strep, and basic labs for further evaluation.  She has not had any fevers, chills or weight loss  Amount and/or Complexity of Data Reviewed Labs: ordered.    Details: Will unremarkable, COVID/flu negative, strep negative Discussion of management or test interpretation with external provider(s): Discussed patient, I am suspicious that her lymphadenopathy may be secondary to dental infection, will start her on Augmentin, and I provided her with information for follow-up with dental.  I reported that if it is not improving, or is repeatedly coming back she will need to have an ultrasound and a biopsy done outpatient.  We discussed that the causes of lymphadenopathy are multiple, including viral, bacterial, or even cancerous causes.  She was instructed that she will need to follow-up on her lymphadenopathy for further evaluation and that if she did not get the tooth pulled, and it was secondary to the tooth, and that the infection will keep coming back  Risk Prescription drug management.    Final Clinical  Impression(s) / ED Diagnoses Final diagnoses:  Lymphadenopathy of head and neck region    Rx / DC Orders ED Discharge Orders          Ordered    amoxicillin-clavulanate (AUGMENTIN) 875-125 MG tablet  Every 12 hours        10/01/23 2114              Adreyan Carbajal, Harley Alto, PA 10/01/23 2119    Virgina Norfolk, DO 10/01/23 2345

## 2023-10-01 NOTE — Discharge Instructions (Addendum)
Your enlarged lymph node is likely secondary to a tooth infection.  I have started you on something called Augmentin, you will need to take it for the next 10 days, see if you have any improvement in your enlarged lymph node, and you will need to have your tooth pulled.  I provided you a list of dental resources, as well as to possibly follow-up with the student dental clinics at [facility]. If your  enlarged lymph node is not improving, you will need to follow-up with your primary care doctor, and get an ultrasound of the area and a possible biopsy.

## 2023-10-02 ENCOUNTER — Other Ambulatory Visit (HOSPITAL_COMMUNITY): Payer: Self-pay

## 2023-10-09 ENCOUNTER — Ambulatory Visit
Admission: EM | Admit: 2023-10-09 | Discharge: 2023-10-09 | Disposition: A | Discharge status: Home / Self Care | Payer: No Typology Code available for payment source | Attending: Emergency Medicine | Admitting: Emergency Medicine

## 2023-10-09 ENCOUNTER — Emergency Department
Admission: EM | Admit: 2023-10-09 | Discharge: 2023-10-10 | Disposition: A | Discharge status: Home / Self Care | Payer: 59 | Attending: Emergency Medicine | Admitting: Emergency Medicine

## 2023-10-09 DIAGNOSIS — Z0441 Encounter for examination and observation following alleged adult rape: Secondary | ICD-10-CM | POA: Diagnosis present

## 2023-10-09 DIAGNOSIS — T7421XA Adult sexual abuse, confirmed, initial encounter: Secondary | ICD-10-CM | POA: Diagnosis present

## 2023-10-09 MED ORDER — ACETAMINOPHEN 500 MG PO TABS
1000.0000 mg | ORAL_TABLET | Freq: Once | ORAL | Status: AC
  Administered 2023-10-09: 1000 mg via ORAL
  Filled 2023-10-09: qty 2
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
## 2023-10-10 NOTE — ED Notes (Signed)
SANE Nurse to continue to be at bedside at this time.
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
## 2023-10-10 NOTE — ED Notes (Signed)
Pt with SANE RN at bedside. Monitors currently not on. Will be placed back on pt when SANE RN completes examination
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
## 2023-10-10 NOTE — SANE Note (Signed)
On 10/10/2023, at approximately 0500 hours, the SANE/FNE Teacher, music) consult was completed. The primary RN and provider have been notified. Please contact the SANE/FNE nurse on call (listed in Amion) with any further concerns.

## 2023-10-10 NOTE — ED Notes (Signed)
SANE to bedside with patient
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
## 2023-10-10 NOTE — ED Notes (Signed)
Pt remains with SANE RN at bedside. Monitors will be placed back on pt once SANE RN is complete

## 2023-10-28 ENCOUNTER — Ambulatory Visit: Payer: 59 | Admitting: Obstetrics and Gynecology

## 2023-12-17 ENCOUNTER — Other Ambulatory Visit: Payer: Self-pay | Admitting: Family Medicine

## 2023-12-17 DIAGNOSIS — A6000 Herpesviral infection of urogenital system, unspecified: Secondary | ICD-10-CM

## 2024-01-14 ENCOUNTER — Encounter: Payer: 59 | Admitting: Family Medicine

## 2024-01-17 ENCOUNTER — Other Ambulatory Visit: Payer: Self-pay | Admitting: Family Medicine

## 2024-01-17 DIAGNOSIS — J309 Allergic rhinitis, unspecified: Secondary | ICD-10-CM

## 2024-01-27 ENCOUNTER — Ambulatory Visit (INDEPENDENT_AMBULATORY_CARE_PROVIDER_SITE_OTHER): Payer: 59 | Admitting: Family Medicine

## 2024-01-27 ENCOUNTER — Encounter: Payer: Self-pay | Admitting: Family Medicine

## 2024-01-27 VITALS — BP 130/75 | HR 80 | Ht 65.0 in | Wt 125.8 lb

## 2024-01-27 DIAGNOSIS — D329 Benign neoplasm of meninges, unspecified: Secondary | ICD-10-CM

## 2024-01-27 DIAGNOSIS — Z0000 Encounter for general adult medical examination without abnormal findings: Secondary | ICD-10-CM

## 2024-01-27 DIAGNOSIS — R5383 Other fatigue: Secondary | ICD-10-CM

## 2024-01-27 DIAGNOSIS — Z23 Encounter for immunization: Secondary | ICD-10-CM

## 2024-01-27 DIAGNOSIS — Z599 Problem related to housing and economic circumstances, unspecified: Secondary | ICD-10-CM | POA: Insufficient documentation

## 2024-01-27 NOTE — Assessment & Plan Note (Signed)
[employer] referral placed for financial strain and help with becoming insured. Counseled to reach out to current job regarding what health insurance they offer.

## 2024-01-27 NOTE — Assessment & Plan Note (Signed)
Repeat monitoring MRI due July 2025.

## 2024-01-27 NOTE — Progress Notes (Signed)
    SUBJECTIVE:   CHIEF COMPLAINT / HPI:   Allergies have been worse recently. Would like refill of allergy medicine. Has had runny nose. Started cleaning house.   States she is having some unintentional weight loss. Has been going to the gym again. Recently moved into a house. Works at [employer] center. Has bowel movements often after meals. Appetite is normal.  Has also had increased fatigue. Has had much higher activity level.  States has hot flash every day.  Bowel movement no blood, looser.   Does not have insurance right now. Would be okay with Child psychotherapist. Has had increased stress, now uncles POA.  Waiting on legal guardianship.1   Flu and Covid - Good with that. Colonoscopy?   Needs Mammogram 1 month.  PHQ-9 - Score 3.  PERTINENT  PMH / PSH: HTN, Hx of meningeal tumor  OBJECTIVE:   BP 130/75   Pulse 80   Ht 5\' 5"  (1.651 m)   Wt 125 lb 12.8 oz (57.1 kg)   LMP  (LMP Unknown)   SpO2 97%   BMI 20.93 kg/m   General: A&O, NAD HEENT: No sign of trauma, EOM grossly intact, moist mucous membranes Cardiac: RRR, no m/r/g Respiratory: CTAB, normal WOB, no w/c/r GI: Soft, NTTP, non-distended, no rebound or guarding Extremities: NTTP, no peripheral edema. Neuro: Moves all four extremities appropriately. Psych: Appropriate mood and affect   ASSESSMENT/PLAN:   Assessment & Plan Annual physical exam Due for mammogram 1 month. Phone number for free program provided given lack of current insurance. Colonoscopy referral once insurance in place. Other fatigue Mild fatigue present for months in the setting of increased activity and exercise, and low unintentional weight loss (10lbs in 1 year). Differential broad (anemia, CHF, COPD, OSA, malignancy, stress, depression, poor sleep hygiene). Low suspicion for malignancy given up-to-date recommended for age cancer screening, but would pursue basic lab work-up. Will defer to next visit once patient has  insurance. -CMP, LDH, Mag, CBC w diff, TSH w/ reflex T4 at 26mo f/u -Colonoscopy order at 26mo f/u -STOPBANG next appointment Financial difficulties [employer] referral placed for financial strain and help with becoming insured. Counseled to reach out to current job regarding what health insurance they offer. Encounter for immunization Declined Flu Vaccine. Covid vaccine administered today. Benign neoplasm of meninges [facility]) Repeat monitoring MRI due July 2025.  Return in about 1 month (around 02/24/2024).  Celine Mans, MD [facility]

## 2024-01-27 NOTE — Assessment & Plan Note (Addendum)
Mild fatigue present for months in the setting of increased activity and exercise, and low unintentional weight loss (10lbs in 1 year). Differential broad (anemia, CHF, COPD, OSA, malignancy, stress, depression, poor sleep hygiene). Low suspicion for malignancy given up-to-date recommended for age cancer screening, but would pursue basic lab work-up. Will defer to next visit once patient has insurance. -CMP, LDH, Mag, CBC w diff, TSH w/ reflex T4 at 435mo f/u -Colonoscopy order at 435mo f/u -STOPBANG next appointment

## 2024-01-27 NOTE — Patient Instructions (Addendum)
It was wonderful to see you today.  Please bring ALL of your medications with you to every visit.   Today we talked about:  Please make a follow-up appointment in 1 month. We will get labs to evaluate your fatigue at that visit. Please STOP Afrin, and start Flonase to help with your allergies. Once you have insurance, we will get labs and order you colonoscopy.  There is a program that can help you get your mammogram for free before you have insurance  Call Schedule an Appointment To determine your eligibility or learn more about this program, call [phone].

## 2024-01-28 ENCOUNTER — Telehealth: Payer: Self-pay | Admitting: *Deleted

## 2024-01-28 NOTE — Progress Notes (Signed)
Complex Care Management Note Care Guide Note  01/28/2024 Name: Cynthia Orozco MRN: [id number] DOB: 1961-03-19   Complex Care Management Outreach Attempts: An unsuccessful telephone outreach was attempted today to offer the patient information about available complex care management services.  Follow Up Plan:  Additional outreach attempts will be made to offer the patient complex care management information and services.   Encounter Outcome:  No Answer  Gwenevere Ghazi  [facility]  [facility], [facility] Guide  Direct Dial: [phone]  Fax [phone]

## 2024-01-31 NOTE — Progress Notes (Signed)
Complex Care Management Note  Care Guide Note 01/31/2024 Name: Cynthia Orozco MRN: [id number] DOB: October 05, 1961  Cynthia Orozco Cynthia Orozco is a 63 y.o. year old female who sees Celine Mans, MD for primary care. I reached out to [employer] by phone today to offer complex care management services.  Ms. Ell was given information about Complex Care Management services today including:   The Complex Care Management services include support from the care team which includes your Nurse Care Manager, Clinical Social Worker, or Pharmacist.  The Complex Care Management team is here to help remove barriers to the health concerns and goals most important to you. Complex Care Management services are voluntary, and the patient may decline or stop services at any time by request to their care team member.   Complex Care Management Consent Status: Patient agreed to services and verbal consent obtained.   Follow up plan:  Telephone appointment with complex care management team member scheduled for:  2/18  Encounter Outcome:  Patient Scheduled  Gwenevere Ghazi  [facility]  [facility], [facility] Guide  Direct Dial: [phone]  Fax [phone]

## 2024-02-01 ENCOUNTER — Ambulatory Visit: Payer: Self-pay | Admitting: Licensed Clinical Social Worker

## 2024-02-01 NOTE — Patient Outreach (Signed)
  Care Coordination   02/01/2024 Name: Cynthia Orozco MRN: [id number] DOB: [birth year]   Care Coordination Outreach Attempts:  An unsuccessful telephone outreach was attempted today to offer the patient information about available complex care management services.  LCSW A. Felton Clinton called patient twice at phone number listed in appt notes and epic. Unable to reach patient, left voicemail requesting callback.   Follow Up Plan:  Additional outreach attempts will be made to offer the patient complex care management information and services.   Encounter Outcome:  No Answer   Care Coordination Interventions:  No, not indicated    Gwyndolyn Saxon MSW, LCSW Licensed Clinical Social Worker  [facility], [facility] Direct Dial: [phone]  Fax: [phone]

## 2024-02-08 ENCOUNTER — Telehealth: Payer: Self-pay | Admitting: *Deleted

## 2024-02-08 NOTE — Progress Notes (Unsigned)
 Complex Care Management Care Guide Note  02/08/2024 Name: Cynthia Orozco MRN: [id number] DOB: 1961-03-05  Cynthia Orozco is a 63 y.o. year old female who is a primary care patient of Celine Mans, MD and is actively engaged with the care management team. I reached out to [employer] by phone today to assist with re-scheduling  with the Licensed Clinical Child psychotherapist.  Follow up plan:  Unsuccessful telephone outreach attempt made.   Gwenevere Ghazi  [facility]  [facility], [facility] Guide  Direct Dial: [phone]  Fax [phone]

## 2024-02-11 NOTE — Progress Notes (Signed)
 Complex Care Management Care Guide Note  02/11/2024 Name: Cynthia Orozco MRN: [id number] DOB: [birth year]  [name] is a 63 y.o. year old female who is a primary care patient of Celine Mans, MD and is actively engaged with the care management team. I reached out to [employer] by phone today to assist with re-scheduling  with the Licensed Clinical Child psychotherapist.  Follow up plan: Telephone appointment with complex care management team member scheduled for:  3/3  Gwenevere Ghazi  [facility]  [facility] Guide  Direct Dial: [phone]  Fax [phone]

## 2024-02-14 ENCOUNTER — Ambulatory Visit: Payer: Self-pay | Admitting: Licensed Clinical Social Worker

## 2024-02-14 ENCOUNTER — Telehealth: Payer: Self-pay | Admitting: Licensed Clinical Social Worker

## 2024-02-14 NOTE — Patient Outreach (Signed)
 Care Coordination   Initial Visit Note   02/14/2024 Name: Olene Godfrey MRN: [id number] DOB: June 09, 1961  Marcelino Freestone Iiams is a 63 y.o. year old female who sees Celine Mans, MD for primary care. I spoke with  Marcelino Freestone Faries by phone today.  What matters to the patients health and wellness today?  Pt reports she has active health insurance and does not require assistance with care mgmt.     Goals Addressed   None     SDOH assessments and interventions completed:  Yes  SDOH Interventions Today    Flowsheet Row Most Recent Value  SDOH Interventions   Food Insecurity Interventions Intervention Not Indicated  Housing Interventions Intervention Not Indicated  Transportation Interventions Intervention Not Indicated  Utilities Interventions Intervention Not Indicated        Care Coordination Interventions:  Yes, provided  Interventions Today    Flowsheet Row Most Recent Value  General Interventions   General Interventions Discussed/Reviewed Community Resources  Education Interventions   Education Provided Provided Education  Provided Verbal Education On Insurance Plans  [Pt advise she has active health insurance. Pt encourage to apply for [place] medicaid in the future event her insurance lapse.]       Follow up plan: No further intervention required.   Encounter Outcome:  Patient Visit Completed   Gwyndolyn Saxon MSW, LCSW Licensed Clinical Social Worker  [facility], Population Health Direct Dial: [phone]  Fax: [phone]

## 2024-02-14 NOTE — Patient Outreach (Signed)
 Care Coordination   02/14/2024 Name: Cynthia Orozco MRN: [id number] DOB: [birth year]   Care Coordination Outreach Attempts:  A second unsuccessful outreach was attempted today to offer the patient with information about available complex care management services.  Called pt at 1:03pm left voicemail requesting callback.   Follow Up Plan:  Additional outreach attempts will be made to offer the patient complex care management information and services.   Encounter Outcome:  No Answer   Care Coordination Interventions:  No, not indicated     Gwyndolyn Saxon MSW, LCSW Licensed Clinical Social Worker  [facility], Population Health Direct Dial: [phone]  Fax: [phone]

## 2024-02-14 NOTE — Patient Instructions (Signed)
 Visit Information  Thank you for taking time to visit with me today. Please don't hesitate to contact me if I can be of assistance to you.   Following are the goals we discussed today:   Goals Addressed   None        Please call the care guide team at [phone] if you need to cancel or reschedule your appointment.   If you are experiencing a Mental Health or Behavioral Health Crisis or need someone to talk to, please call 911   Patient verbalizes understanding of instructions and care plan provided today and agrees to view in MyChart. Active MyChart status and patient understanding of how to access instructions and care plan via MyChart confirmed with patient.     The patient has been provided with contact information for the care management team and has been advised to call with any health related questions or concerns.   Gwyndolyn Saxon MSW, LCSW Licensed Clinical Social Worker  [facility], Population Health Direct Dial: [phone]  Fax: [phone]

## 2024-03-02 ENCOUNTER — Telehealth: Payer: Self-pay | Admitting: *Deleted

## 2024-03-02 NOTE — Progress Notes (Signed)
 Complex Care Management Care Guide Note  03/02/2024 Name: Cynthia Orozco MRN: [id number] DOB: Jan 26, 1961  Cynthia Orozco is a 63 y.o. year old female who is a primary care patient of Celine Mans, MD and is actively engaged with the care management team. I reached out to [employer] by phone today to assist with re-scheduling  with the Licensed Clinical Child psychotherapist.  Follow up plan: Unsuccessful telephone outreach attempt made. A HIPAA compliant phone message was left for the patient providing contact information and requesting a return call.  Burman Nieves, CMA, Care Guide [facility]  [facility], [facility] Guide Direct Dial: [phone]  Fax: [phone] Website: [email or web address]

## 2024-03-06 ENCOUNTER — Ambulatory Visit: Payer: Self-pay | Admitting: Family Medicine

## 2024-03-10 NOTE — Progress Notes (Signed)
 Complex Care Management Care Guide Note  03/10/2024 Name: Cynthia Orozco MRN: [id number] DOB: [birth year]  [name] is a 63 y.o. year old female who is a primary care patient of Celine Mans, MD and is actively engaged with the care management team. I reached out to [employer] by phone today to assist with re-scheduling  with the Licensed Clinical Child psychotherapist.  Follow up plan: Unsuccessful telephone outreach attempt made. A HIPAA compliant phone message was left for the patient providing contact information and requesting a return call. No further outreach attempts will be made due to inability maintain patient contact.   Burman Nieves, CMA, Care Guide [facility]  [facility], [facility] Guide Direct Dial: [phone]  Fax: [phone] Website: [email or web address]

## 2024-06-28 ENCOUNTER — Other Ambulatory Visit: Payer: Self-pay | Admitting: Family Medicine

## 2024-06-28 DIAGNOSIS — Z1231 Encounter for screening mammogram for malignant neoplasm of breast: Secondary | ICD-10-CM
